# Patient Record
Sex: Female | Born: 1960
Health system: Southern US, Community
[De-identification: ages and names within clinical notes are randomized; demographics above are authoritative.]

## PROBLEM LIST (undated history)

## (undated) DIAGNOSIS — E785 Hyperlipidemia, unspecified: Secondary | ICD-10-CM

## (undated) DIAGNOSIS — K635 Polyp of colon: Secondary | ICD-10-CM

## (undated) DIAGNOSIS — I10 Essential (primary) hypertension: Secondary | ICD-10-CM

## (undated) DIAGNOSIS — R030 Elevated blood-pressure reading, without diagnosis of hypertension: Secondary | ICD-10-CM

## (undated) DIAGNOSIS — R002 Palpitations: Secondary | ICD-10-CM

## (undated) DIAGNOSIS — E669 Obesity, unspecified: Secondary | ICD-10-CM

## (undated) HISTORY — DX: Hyperlipidemia, unspecified: E78.5

## (undated) HISTORY — DX: Essential (primary) hypertension: I10

## (undated) HISTORY — DX: Polyp of colon: K63.5

## (undated) HISTORY — PX: TONSILLECTOMY: SUR1361

## (undated) HISTORY — DX: Elevated blood-pressure reading, without diagnosis of hypertension: R03.0

## (undated) HISTORY — DX: Palpitations: R00.2

## (undated) HISTORY — DX: Obesity, unspecified: E66.9

---

## 1998-01-26 ENCOUNTER — Emergency Department (HOSPITAL_COMMUNITY): Admission: EM | Admit: 1998-01-26 | Discharge: 1998-01-26 | Payer: Self-pay | Admitting: Emergency Medicine

## 1998-01-29 ENCOUNTER — Other Ambulatory Visit: Admission: RE | Admit: 1998-01-29 | Discharge: 1998-01-29 | Payer: Self-pay | Admitting: Obstetrics & Gynecology

## 1998-03-07 ENCOUNTER — Inpatient Hospital Stay (HOSPITAL_COMMUNITY): Admission: AD | Admit: 1998-03-07 | Discharge: 1998-03-09 | Payer: Self-pay | Admitting: Obstetrics and Gynecology

## 1998-04-16 ENCOUNTER — Other Ambulatory Visit: Admission: RE | Admit: 1998-04-16 | Discharge: 1998-04-16 | Payer: Self-pay | Admitting: Obstetrics & Gynecology

## 1999-07-10 ENCOUNTER — Other Ambulatory Visit: Admission: RE | Admit: 1999-07-10 | Discharge: 1999-07-10 | Payer: Self-pay | Admitting: Obstetrics and Gynecology

## 1999-07-26 ENCOUNTER — Encounter: Payer: Self-pay | Admitting: Obstetrics and Gynecology

## 1999-07-26 ENCOUNTER — Ambulatory Visit (HOSPITAL_COMMUNITY): Admission: RE | Admit: 1999-07-26 | Discharge: 1999-07-26 | Payer: Self-pay | Admitting: Obstetrics and Gynecology

## 2000-11-02 ENCOUNTER — Other Ambulatory Visit: Admission: RE | Admit: 2000-11-02 | Discharge: 2000-11-02 | Payer: Self-pay | Admitting: Obstetrics and Gynecology

## 2001-11-26 ENCOUNTER — Other Ambulatory Visit: Admission: RE | Admit: 2001-11-26 | Discharge: 2001-11-26 | Payer: Self-pay | Admitting: Obstetrics and Gynecology

## 2001-12-23 ENCOUNTER — Encounter: Payer: Self-pay | Admitting: Obstetrics and Gynecology

## 2001-12-23 ENCOUNTER — Ambulatory Visit (HOSPITAL_COMMUNITY): Admission: RE | Admit: 2001-12-23 | Discharge: 2001-12-23 | Payer: Self-pay | Admitting: Obstetrics and Gynecology

## 2003-01-26 ENCOUNTER — Other Ambulatory Visit: Admission: RE | Admit: 2003-01-26 | Discharge: 2003-01-26 | Payer: Self-pay | Admitting: Obstetrics and Gynecology

## 2003-02-16 ENCOUNTER — Encounter: Payer: Self-pay | Admitting: Obstetrics and Gynecology

## 2003-02-16 ENCOUNTER — Ambulatory Visit (HOSPITAL_COMMUNITY): Admission: RE | Admit: 2003-02-16 | Discharge: 2003-02-16 | Payer: Self-pay | Admitting: Obstetrics and Gynecology

## 2004-05-29 ENCOUNTER — Other Ambulatory Visit: Admission: RE | Admit: 2004-05-29 | Discharge: 2004-05-29 | Payer: Self-pay | Admitting: Obstetrics and Gynecology

## 2004-07-04 ENCOUNTER — Ambulatory Visit (HOSPITAL_COMMUNITY): Admission: RE | Admit: 2004-07-04 | Discharge: 2004-07-04 | Payer: Self-pay | Admitting: Obstetrics and Gynecology

## 2005-06-18 ENCOUNTER — Other Ambulatory Visit: Admission: RE | Admit: 2005-06-18 | Discharge: 2005-06-18 | Payer: Self-pay | Admitting: Obstetrics and Gynecology

## 2005-08-07 ENCOUNTER — Ambulatory Visit (HOSPITAL_COMMUNITY): Admission: RE | Admit: 2005-08-07 | Discharge: 2005-08-07 | Payer: Self-pay | Admitting: Obstetrics and Gynecology

## 2005-08-27 ENCOUNTER — Encounter: Admission: RE | Admit: 2005-08-27 | Discharge: 2005-08-27 | Payer: Self-pay | Admitting: Obstetrics and Gynecology

## 2006-03-06 ENCOUNTER — Encounter: Admission: RE | Admit: 2006-03-06 | Discharge: 2006-03-06 | Payer: Self-pay | Admitting: Obstetrics and Gynecology

## 2007-03-30 ENCOUNTER — Encounter: Admission: RE | Admit: 2007-03-30 | Discharge: 2007-03-30 | Payer: Self-pay | Admitting: Obstetrics and Gynecology

## 2007-08-16 HISTORY — PX: US ECHOCARDIOGRAPHY: HXRAD669

## 2008-04-24 ENCOUNTER — Ambulatory Visit (HOSPITAL_COMMUNITY): Admission: RE | Admit: 2008-04-24 | Discharge: 2008-04-24 | Payer: Self-pay | Admitting: Obstetrics and Gynecology

## 2009-01-04 HISTORY — PX: OTHER SURGICAL HISTORY: SHX169

## 2009-05-23 ENCOUNTER — Ambulatory Visit (HOSPITAL_COMMUNITY): Admission: RE | Admit: 2009-05-23 | Discharge: 2009-05-23 | Payer: Self-pay | Admitting: Obstetrics and Gynecology

## 2010-10-02 ENCOUNTER — Other Ambulatory Visit (HOSPITAL_COMMUNITY): Payer: Self-pay | Admitting: Obstetrics and Gynecology

## 2010-10-02 DIAGNOSIS — Z139 Encounter for screening, unspecified: Secondary | ICD-10-CM

## 2010-10-02 DIAGNOSIS — Z1231 Encounter for screening mammogram for malignant neoplasm of breast: Secondary | ICD-10-CM

## 2010-10-16 ENCOUNTER — Ambulatory Visit (HOSPITAL_COMMUNITY): Payer: BC Managed Care – PPO

## 2010-10-23 ENCOUNTER — Encounter (HOSPITAL_COMMUNITY): Payer: Self-pay

## 2010-10-23 ENCOUNTER — Ambulatory Visit (HOSPITAL_COMMUNITY)
Admission: RE | Admit: 2010-10-23 | Discharge: 2010-10-23 | Disposition: A | Discharge status: Home / Self Care | Payer: BC Managed Care – PPO | Source: Ambulatory Visit | Attending: Obstetrics and Gynecology | Admitting: Obstetrics and Gynecology

## 2010-10-23 DIAGNOSIS — Z1231 Encounter for screening mammogram for malignant neoplasm of breast: Secondary | ICD-10-CM | POA: Insufficient documentation

## 2011-01-10 ENCOUNTER — Encounter: Payer: Self-pay | Admitting: Nurse Practitioner

## 2011-01-10 ENCOUNTER — Encounter: Payer: Self-pay | Admitting: Cardiology

## 2011-01-10 ENCOUNTER — Telehealth: Payer: Self-pay | Admitting: Cardiology

## 2011-01-10 ENCOUNTER — Ambulatory Visit (INDEPENDENT_AMBULATORY_CARE_PROVIDER_SITE_OTHER): Payer: BC Managed Care – PPO | Admitting: Nurse Practitioner

## 2011-01-10 DIAGNOSIS — E785 Hyperlipidemia, unspecified: Secondary | ICD-10-CM

## 2011-01-10 DIAGNOSIS — R079 Chest pain, unspecified: Secondary | ICD-10-CM | POA: Insufficient documentation

## 2011-01-10 DIAGNOSIS — I10 Essential (primary) hypertension: Secondary | ICD-10-CM | POA: Insufficient documentation

## 2011-01-10 DIAGNOSIS — R030 Elevated blood-pressure reading, without diagnosis of hypertension: Secondary | ICD-10-CM | POA: Insufficient documentation

## 2011-01-10 MED ORDER — ASPIRIN EC 81 MG PO TBEC: 81.0000 mg | DELAYED_RELEASE_TABLET | Freq: Every day | ORAL | Status: DC

## 2011-01-10 MED ORDER — NITROGLYCERIN 0.4 MG SL SUBL: 0.4000 mg | SUBLINGUAL_TABLET | SUBLINGUAL | Status: DC | PRN

## 2011-01-10 NOTE — Telephone Encounter (Signed)
Called stating she has been having "slight chest discomfort"-"pressure" over past few days. Wanted to be seen today. Spoke w/ Lawson Fiscal; will see this afternoon

## 2011-01-10 NOTE — Assessment & Plan Note (Signed)
Will check fasting labs next week. More than likely she will need an lipid lowering agent.

## 2011-01-10 NOTE — Patient Instructions (Signed)
I want you to start on a baby aspirin each day.  I have sent in a prescription for NTG to use if needed. Use your NTG under your tongue for recurrent chest pain. May take one tablet every 5 minutes. If you are still having discomfort after 3 tablets in 15 minutes, call 911.  We will arrange for a stress test early next week.

## 2011-01-10 NOTE — Telephone Encounter (Signed)
Having some chest discomfort and wants to speak w/RN to see if she should come in

## 2011-01-10 NOTE — Progress Notes (Signed)
   Nicole Jefferson Date of Birth: 1961/09/07   History of Present Illness: Nicole Jefferson is seen today for a work in visit. She is seen for Dr. Deborah Jefferson. She has been having some chest discomfort over the last day or so. It is under her breasts. Sometimes, she feels a fullness in her throat. It is not exertional in nature. Yesterday, she felt tightness in her chest. She could feel it in her throat. She does not exercise. She has a strikingly positive family history. Her brother had sudden death/MI last week and had the "cooling" protocol. She did not take the statin as prescribed on a past visit. Cholesterol levels were borderline. Blood pressure is up a little today. She has had no recent lab work and is due for a physical next month at the Urgent Care. She comes in more today because of concern for her own cardiovascular risk. She is not really having palpitations. She does have DOE with stairs. She has continued to gain weight. She takes no medicines on a routine basis.    No current outpatient prescriptions on file prior to visit.    No Known Allergies  Past Medical History  Diagnosis Date  . Palpitations   . Obese   . Borderline hyperlipidemia   . Borderline systolic HTN   . Colon polyp     Past Surgical History  Procedure Date  . Tonsillectomy   . US echocardiography 08/16/07    stress echo negative  . 48 hr monitor 01/04/09    no significant arrhythmia    History  Smoking status  . Never Smoker   Smokeless tobacco  . Not on file    History  Alcohol Use No    Family History  Problem Relation Age of Onset  . Heart disease Father   . Cancer Father   . Heart attack Brother     5/12    Review of Systems: The review of systems is as above. She does have issues with her back.  All other systems were reviewed and are negative.  Physical Exam: BP 144/90  Pulse 68  Ht 5\' 5"  (1.651 m)  Wt 209 lb (94.802 kg)  BMI 34.78 kg/m2  LMP 12/22/2010 Patient is very pleasant and  in no acute distress. She is currently pain free. She is obese. Skin is warm and dry. Color is normal.  HEENT is unremarkable. Normocephalic/atraumatic. PERRL. Sclera are nonicteric. Neck is supple. No masses. No JVD. Lungs are clear. Cardiac exam shows a regular rate and rhythm. Abdomen is soft. Extremities are without edema. Gait and ROM are intact. No gross neurologic deficits noted.  LABORATORY DATA:  EKG is normal   Assessment / Plan:

## 2011-01-10 NOTE — Assessment & Plan Note (Signed)
Will recheck on return and start medicines as needed.

## 2011-01-10 NOTE — Telephone Encounter (Signed)
Some chest discomfort-begin yesterday. She wants to make sure that her heart is OK.  Patient sounds calm and does not sound like she is in distress. Pls.call to make further assessment.

## 2011-01-10 NOTE — Assessment & Plan Note (Signed)
She has multiple cardiovascular risk factors (obesity, borderline blood pressure, hyperlipidemia and positive family history). I have started her on aspirin therapy. NTG sl has been prescribed.  I have ordered a stress test. We will check fasting labs on her next week. Regardless if she passes or fails her stress test, she needs a follow up visit to address these other risk factors. Patient is agreeable to this plan and will call if any problems develop in the interim.

## 2011-01-14 ENCOUNTER — Telehealth: Payer: Self-pay | Admitting: Cardiology

## 2011-01-14 ENCOUNTER — Other Ambulatory Visit (INDEPENDENT_AMBULATORY_CARE_PROVIDER_SITE_OTHER): Payer: BC Managed Care – PPO | Admitting: *Deleted

## 2011-01-14 ENCOUNTER — Other Ambulatory Visit: Payer: Self-pay | Admitting: *Deleted

## 2011-01-14 DIAGNOSIS — E785 Hyperlipidemia, unspecified: Secondary | ICD-10-CM

## 2011-01-14 DIAGNOSIS — E7800 Pure hypercholesterolemia, unspecified: Secondary | ICD-10-CM

## 2011-01-14 DIAGNOSIS — Z79899 Other long term (current) drug therapy: Secondary | ICD-10-CM

## 2011-01-14 LAB — CBC WITH DIFFERENTIAL/PLATELET
Basophils Absolute: 0 10*3/uL (ref 0.0–0.1)
Basophils Relative: 0.5 % (ref 0.0–3.0)
Eosinophils Absolute: 0.1 10*3/uL (ref 0.0–0.7)
Eosinophils Relative: 2.4 % (ref 0.0–5.0)
HCT: 37.5 % (ref 36.0–46.0)
Hemoglobin: 12.8 g/dL (ref 12.0–15.0)
Lymphocytes Relative: 23.8 % (ref 12.0–46.0)
Lymphs Abs: 1 10*3/uL (ref 0.7–4.0)
MCHC: 34.2 g/dL (ref 30.0–36.0)
MCV: 89.9 fl (ref 78.0–100.0)
Monocytes Absolute: 0.3 10*3/uL (ref 0.1–1.0)
Monocytes Relative: 7.6 % (ref 3.0–12.0)
Neutro Abs: 2.9 10*3/uL (ref 1.4–7.7)
Neutrophils Relative %: 65.7 % (ref 43.0–77.0)
Platelets: 237 10*3/uL (ref 150.0–400.0)
RBC: 4.17 Mil/uL (ref 3.87–5.11)
RDW: 13.5 % (ref 11.5–14.6)
WBC: 4.4 10*3/uL — ABNORMAL LOW (ref 4.5–10.5)

## 2011-01-14 LAB — HEPATIC FUNCTION PANEL
ALT: 11 U/L (ref 0–35)
AST: 15 U/L (ref 0–37)
Albumin: 3.7 g/dL (ref 3.5–5.2)
Alkaline Phosphatase: 42 U/L (ref 39–117)
Bilirubin, Direct: 0 mg/dL (ref 0.0–0.3)
Total Bilirubin: 0.6 mg/dL (ref 0.3–1.2)
Total Protein: 6.6 g/dL (ref 6.0–8.3)

## 2011-01-14 LAB — BASIC METABOLIC PANEL
BUN: 14 mg/dL (ref 6–23)
CO2: 27 mEq/L (ref 19–32)
Calcium: 8.9 mg/dL (ref 8.4–10.5)
Chloride: 109 mEq/L (ref 96–112)
Creatinine, Ser: 0.7 mg/dL (ref 0.4–1.2)
GFR: 88.1 mL/min (ref >60.00)
Glucose, Bld: 90 mg/dL (ref 70–99)
Potassium: 4.3 mEq/L (ref 3.5–5.1)
Sodium: 142 mEq/L (ref 135–145)

## 2011-01-14 LAB — LIPID PANEL
Cholesterol: 180 mg/dL (ref 0–200)
HDL: 40 mg/dL (ref >39.00)
LDL Cholesterol: 121 mg/dL — ABNORMAL HIGH (ref 0–99)
Total CHOL/HDL Ratio: 5
Triglycerides: 95 mg/dL (ref 0.0–149.0)
VLDL: 19 mg/dL (ref 0.0–40.0)

## 2011-01-14 LAB — TSH: TSH: 1.82 u[IU]/mL (ref 0.35–5.50)

## 2011-01-14 NOTE — Telephone Encounter (Signed)
Lab results reviewed with patient. Scheduled 6 wk recheck of labs. Medication recommendation of Crestor 5mg  per Lawson Fiscal. Samples available pt to stop by office on May 14 tho pick up

## 2011-01-20 ENCOUNTER — Other Ambulatory Visit (HOSPITAL_COMMUNITY): Payer: BC Managed Care – PPO | Admitting: Radiology

## 2011-01-27 ENCOUNTER — Encounter (HOSPITAL_COMMUNITY): Payer: Self-pay | Admitting: Radiology

## 2011-01-27 ENCOUNTER — Ambulatory Visit (HOSPITAL_COMMUNITY): Payer: BC Managed Care – PPO | Attending: Cardiology | Admitting: Radiology

## 2011-01-27 DIAGNOSIS — R079 Chest pain, unspecified: Secondary | ICD-10-CM | POA: Insufficient documentation

## 2011-01-27 DIAGNOSIS — R0789 Other chest pain: Secondary | ICD-10-CM

## 2011-01-27 DIAGNOSIS — R0602 Shortness of breath: Secondary | ICD-10-CM

## 2011-01-27 DIAGNOSIS — R0989 Other specified symptoms and signs involving the circulatory and respiratory systems: Secondary | ICD-10-CM

## 2011-01-27 MED ORDER — REGADENOSON 0.4 MG/5ML IV SOLN
0.4000 mg | Freq: Once | INTRAVENOUS | Status: AC
  Administered 2011-01-27: 0.4 mg via INTRAVENOUS

## 2011-01-27 MED ORDER — TECHNETIUM TC 99M TETROFOSMIN IV KIT
10.3000 | PACK | Freq: Once | INTRAVENOUS | Status: AC | PRN
  Administered 2011-01-27: 10 via INTRAVENOUS

## 2011-01-27 MED ORDER — TECHNETIUM TC 99M TETROFOSMIN IV KIT
33.0000 | PACK | Freq: Once | INTRAVENOUS | Status: AC | PRN
  Administered 2011-01-27: 33 via INTRAVENOUS

## 2011-01-27 NOTE — Progress Notes (Signed)
[facility] SITE 3 NUCLEAR MED [address] [place] [address] [phone]  Cardiology Nuclear Med Study  Nicole Jefferson is a 50 y.o. female [id number] Jan 29, 1961   Nuclear Med Background Indication for Stress Test:  Evaluation for Ischemia History: 08 Stress Echo: normal, GXT 3 yrs ago and normal and Myocardial Perfusion Study 8 yrs ago and normal Cardiac Risk Factors: Family History - CAD, Hypertension and Lipids  Symptoms:  Chest Tightness radiates to throat, DOE and Palpitations    Nuclear Pre-Procedure Caffeine/Decaff Intake:  None NPO After: 7:00pm   Lungs:  clear IV 0.9% NS with Angio Cath:  22g  IV Site: R Hand  IV Started by:  Bonnita Levan, RN  Chest Size (in):  38 Cup Size: D  Height: 5\' 5"  (1.651 m)  Weight:  208 lb (94.348 kg)  BMI:  Body mass index is 34.61 kg/(m^2). Tech Comments:  N/A    Nuclear Med Study 1 or 2 day study: 1 day  Stress Test Type:  Treadmill/Lexiscan  Reading MD: Kristeen Miss, MD  Order Authorizing Provider:  Dr. Roger Shelter  Resting Radionuclide: Technetium 77m Tetrofosmin  Resting Radionuclide Dose: 10.3 mCi   Stress Radionuclide:  Technetium 50m Tetrofosmin  Stress Radionuclide Dose: 33 mCi           Stress Protocol Rest HR: 69 Stress HR: 126  Rest BP: 113/77 Stress BP: 145/76  Exercise Time (min): 2:00 METS: 1.6   Predicted Max HR: 170 bpm % Max HR: 74.12 bpm Rate Pressure Product: 21308   Dose of Adenosine (mg):  n/a Dose of Lexiscan: 0.4 mg  Dose of Atropine (mg): n/a Dose of Dobutamine: n/a mcg/kg/min (at max HR)  Stress Test Technologist: Cathlyn Parsons, RN  Nuclear Technologist:  Doyne Keel, CNMT     Rest Procedure:  Myocardial perfusion imaging was performed at rest 45 minutes following the intravenous administration of Technetium 30m Tetrofosmin. Rest ECG: NSR  Stress Procedure:  The patient received IV Lexiscan 0.4 mg over 15-seconds with concurrent low level exercise and then  Technetium 48m Tetrofosmin was injected at 30-seconds while the patient continued walking one more minute. Patient had chest tightness 6/10 with infusion of Lexiscan but resolved in recovery. There were no significant changes with Lexiscan.  Quantitative spect images were obtained after a 45-minute delay. Stress ECG: No significant change from baseline ECG  QPS Raw Data Images:  Normal; no motion artifact; normal heart/lung ratio. Stress Images:  Normal homogeneous uptake in all areas of the myocardium. Rest Images:  Normal homogeneous uptake in all areas of the myocardium. Subtraction (SDS):  Normal Transient Ischemic Dilatation (Normal <1.22):  .96 Lung/Heart Ratio (Normal <0.45):  .32  Quantitative Gated Spect Images QGS EDV:  86 ml QGS ESV:  26 ml QGS cine images:  NL LV Function; NL Wall Motion QGS EF: 70%  Impression Exercise Capacity:  Lexiscan with no exercise. BP Response:  Normal blood pressure response. Clinical Symptoms:  No chest pain. ECG Impression:  No significant ST segment change suggestive of ischemia. Comparison with Prior Nuclear Study: No previous nuclear study performed  Overall Impression:  Normal stress nuclear study.  No evidence of ischemia.  Normal LV function.    Elyn Aquas.. MD, [facility]

## 2011-01-28 NOTE — Progress Notes (Signed)
COPY ROUTED TO DR.Tennant.Mirna Mires

## 2011-01-30 NOTE — Progress Notes (Signed)
Stress test results reported to pt

## 2011-02-24 ENCOUNTER — Other Ambulatory Visit: Payer: Self-pay | Admitting: *Deleted

## 2011-02-24 DIAGNOSIS — E785 Hyperlipidemia, unspecified: Secondary | ICD-10-CM

## 2011-02-25 ENCOUNTER — Other Ambulatory Visit: Payer: BC Managed Care – PPO | Admitting: *Deleted

## 2011-10-01 ENCOUNTER — Ambulatory Visit (INDEPENDENT_AMBULATORY_CARE_PROVIDER_SITE_OTHER): Payer: BC Managed Care – PPO

## 2011-10-01 DIAGNOSIS — M25549 Pain in joints of unspecified hand: Secondary | ICD-10-CM

## 2011-10-01 DIAGNOSIS — M6740 Ganglion, unspecified site: Secondary | ICD-10-CM

## 2011-10-13 ENCOUNTER — Telehealth: Payer: Self-pay

## 2011-10-13 ENCOUNTER — Encounter: Payer: Self-pay | Admitting: Family Medicine

## 2011-10-13 ENCOUNTER — Ambulatory Visit (INDEPENDENT_AMBULATORY_CARE_PROVIDER_SITE_OTHER): Payer: BC Managed Care – PPO | Admitting: Family Medicine

## 2011-10-13 VITALS — BP 134/84 | HR 66 | Temp 98.4°F | Resp 16 | Ht 64.25 in | Wt 206.0 lb

## 2011-10-13 DIAGNOSIS — R059 Cough, unspecified: Secondary | ICD-10-CM

## 2011-10-13 DIAGNOSIS — N390 Urinary tract infection, site not specified: Secondary | ICD-10-CM

## 2011-10-13 DIAGNOSIS — H6120 Impacted cerumen, unspecified ear: Secondary | ICD-10-CM

## 2011-10-13 DIAGNOSIS — M67439 Ganglion, unspecified wrist: Secondary | ICD-10-CM | POA: Insufficient documentation

## 2011-10-13 DIAGNOSIS — R05 Cough: Secondary | ICD-10-CM

## 2011-10-13 DIAGNOSIS — R300 Dysuria: Secondary | ICD-10-CM

## 2011-10-13 LAB — POCT UA - MICROSCOPIC ONLY
Casts, Ur, LPF, POC: NEGATIVE
Yeast, UA: NEGATIVE

## 2011-10-13 LAB — POCT URINALYSIS DIPSTICK
Bilirubin, UA: NEGATIVE
Glucose, UA: NEGATIVE
Protein, UA: NEGATIVE

## 2011-10-13 MED ORDER — LEVOFLOXACIN 500 MG PO TABS: 500.0000 mg | ORAL_TABLET | Freq: Every day | ORAL | Status: AC

## 2011-10-13 MED ORDER — HYDROCODONE-HOMATROPINE 5-1.5 MG/5ML PO SYRP: 5.0000 mL | ORAL_SOLUTION | Freq: Four times a day (QID) | ORAL | Status: AC | PRN

## 2011-10-13 NOTE — Progress Notes (Signed)
Pt was triaged by Jaymes Graff, CMA under Oneita Hurt log in.

## 2011-10-13 NOTE — Progress Notes (Signed)
Results for orders placed in visit on 10/13/11  POCT URINALYSIS DIPSTICK      Component Value Range   Color, UA yellow     Clarity, UA hazy     Glucose, UA negative     Bilirubin, UA negative     Ketones, UA negative     Spec Grav, UA >=1.030     Blood, UA negative     pH, UA 5.0     Protein, UA negative     Urobilinogen, UA 0.2     Nitrite, UA negative     Leukocytes, UA Trace    POCT UA - MICROSCOPIC ONLY      Component Value Range   WBC, Ur, HPF, POC 4-6     RBC, urine, microscopic 1-2     Bacteria, U Microscopic small     Mucus, UA small     Epithelial cells, urine per micros 2-4     Crystals, Ur, HPF, POC negative     Casts, Ur, LPF, POC negative     Yeast, UA negative     Nicole Jefferson is a 51 year old woman comes in with several problems. Her right wrist ganglion continues to be tender but function is normal. She has not yet called her hand surgeon hoping that the problem will go away. The problem is that she has malodorous urine, a cough of 3 days and earwax impaction on the right.  Objective: Chest is clear, HEENT is positive for cerumen on the right. The right canal was irrigated clean oropharynx is negative. A 2 mm nodule is still palpable on the right dorsal wrist at the base of the first carpal.  Assessment: Ganglion cyst right hand, UTI, bronchitis.  Plan: AVS

## 2011-10-13 NOTE — Telephone Encounter (Signed)
Patient was seen in office today and was given 2 rx and she went to pick them up and states only one was filled she is needing rx for cough medication. She would like for someone to fax rx to [facility] [address] please contact if any questions.

## 2011-10-13 NOTE — Patient Instructions (Signed)
Ganglion A ganglion is a swelling under the skin that is filled with a thick, jelly-like substance. It is a synovial cyst. This is caused by a break (rupture) of the joint lining from the joint space. A ganglion often occurs near an area of repeated minor trauma (damage caused by an accident). Trauma may also be a repetitive movement at work or in a sport. TREATMENT  It often goes away without treatment. It may reappear later. Sometimes a ganglion may need to be surgically removed. Often they are drained and injected with a steroid. Sometimes they respond to:  Rest.   Splinting.  HOME CARE INSTRUCTIONS   Your caregiver will decide the best way of treating your ganglion. Do not try to break the ganglion yourself by pressing on it, poking it with a needle, or hitting it with a heavy object.   Use medications as directed.  SEEK MEDICAL CARE IF:   The ganglion becomes larger or more painful.   You have increased redness or swelling.   You have weakness or numbness in your hand or wrist.  MAKE SURE YOU:   Understand these instructions.   Will watch your condition.   Will get help right away if you are not doing well or get worse.  Document Released: 08/22/2000 Document Revised: 05/07/2011 Document Reviewed: 10/19/2007 [facility] Patient Information 2012 [place], [place].Bronchitis Bronchitis is the body's way of reacting to injury and/or infection (inflammation) of the bronchi. Bronchi are the air tubes that extend from the windpipe into the lungs. If the inflammation becomes severe, it may cause shortness of breath. CAUSES  Inflammation may be caused by:  A virus.   Germs (bacteria).   Dust.   Allergens.   Pollutants and many other irritants.  The cells lining the bronchial tree are covered with tiny hairs (cilia). These constantly beat upward, away from the lungs, toward the mouth. This keeps the lungs free of pollutants. When these cells become too irritated and are unable to do  their job, mucus begins to develop. This causes the characteristic cough of bronchitis. The cough clears the lungs when the cilia are unable to do their job. Without either of these protective mechanisms, the mucus would settle in the lungs. Then you would develop pneumonia. Smoking is a common cause of bronchitis and can contribute to pneumonia. Stopping this habit is the single most important thing you can do to help yourself. TREATMENT   Your caregiver may prescribe an antibiotic if the cough is caused by bacteria. Also, medicines that open up your airways make it easier to breathe. Your caregiver may also recommend or prescribe an expectorant. It will loosen the mucus to be coughed up. Only take over-the-counter or prescription medicines for pain, discomfort, or fever as directed by your caregiver.   Removing whatever causes the problem (smoking, for example) is critical to preventing the problem from getting worse.   Cough suppressants may be prescribed for relief of cough symptoms.   Inhaled medicines may be prescribed to help with symptoms now and to help prevent problems from returning.   For those with recurrent (chronic) bronchitis, there may be a need for steroid medicines.  SEEK IMMEDIATE MEDICAL CARE IF:   During treatment, you develop more pus-like mucus (purulent sputum).   You have a fever.   Your baby is older than 3 months with a rectal temperature of 102 F (38.9 C) or higher.   Your baby is 31 months old or younger with a rectal temperature of  100.4 F (38 C) or higher.   You become progressively more ill.   You have increased difficulty breathing, wheezing, or shortness of breath.  It is necessary to seek immediate medical care if you are elderly or sick from any other disease. MAKE SURE YOU:   Understand these instructions.   Will watch your condition.   Will get help right away if you are not doing well or get worse.  Document Released: 08/25/2005 Document  Revised: 05/07/2011 Document Reviewed: 07/04/2008 [facility] Patient Information 2012 [place], [place].Urinary Tract Infection Infections of the urinary tract can start in several places. A bladder infection (cystitis), a kidney infection (pyelonephritis), and a prostate infection (prostatitis) are different types of urinary tract infections (UTIs). They usually get better if treated with medicines (antibiotics) that kill germs. Take all the medicine until it is gone. You or your child may feel better in a few days, but TAKE ALL MEDICINE or the infection may not respond and may become more difficult to treat. HOME CARE INSTRUCTIONS   Drink enough water and fluids to keep the urine clear or pale yellow. Cranberry juice is especially recommended, in addition to large amounts of water.   Avoid caffeine, tea, and carbonated beverages. They tend to irritate the bladder.   Alcohol may irritate the prostate.   Only take over-the-counter or prescription medicines for pain, discomfort, or fever as directed by your caregiver.  To prevent further infections:  Empty the bladder often. Avoid holding urine for long periods of time.   After a bowel movement, women should cleanse from front to back. Use each tissue only once.   Empty the bladder before and after sexual intercourse.  FINDING OUT THE RESULTS OF YOUR TEST Not all test results are available during your visit. If your or your child's test results are not back during the visit, make an appointment with your caregiver to find out the results. Do not assume everything is normal if you have not heard from your caregiver or the medical facility. It is important for you to follow up on all test results. SEEK MEDICAL CARE IF:   There is back pain.   Your baby is older than 3 months with a rectal temperature of 100.5 F (38.1 C) or higher for more than 1 day.   Your or your child's problems (symptoms) are no better in 3 days. Return sooner if you or  your child is getting worse.  SEEK IMMEDIATE MEDICAL CARE IF:   There is severe back pain or lower abdominal pain.   You or your child develops chills.   You have a fever.   Your baby is older than 3 months with a rectal temperature of 102 F (38.9 C) or higher.   Your baby is 73 months old or younger with a rectal temperature of 100.4 F (38 C) or higher.   There is nausea or vomiting.   There is continued burning or discomfort with urination.  MAKE SURE YOU:   Understand these instructions.   Will watch your condition.   Will get help right away if you are not doing well or get worse.  Document Released: 06/04/2005 Document Revised: 05/07/2011 Document Reviewed: 01/07/2007 [facility] Patient Information 2012 [place], [place].

## 2011-10-14 NOTE — Telephone Encounter (Signed)
rx for cough syrup called into pharmacy and pt notified

## 2011-10-15 ENCOUNTER — Other Ambulatory Visit: Payer: Self-pay | Admitting: Obstetrics and Gynecology

## 2011-10-15 DIAGNOSIS — Z1231 Encounter for screening mammogram for malignant neoplasm of breast: Secondary | ICD-10-CM

## 2011-10-15 LAB — URINE CULTURE: Colony Count: 5000

## 2011-10-22 ENCOUNTER — Ambulatory Visit (INDEPENDENT_AMBULATORY_CARE_PROVIDER_SITE_OTHER): Payer: BC Managed Care – PPO | Admitting: Family Medicine

## 2011-10-22 VITALS — BP 130/80 | HR 74 | Temp 97.9°F | Resp 16 | Ht 64.5 in | Wt 204.0 lb

## 2011-10-22 DIAGNOSIS — R05 Cough: Secondary | ICD-10-CM

## 2011-10-22 DIAGNOSIS — J069 Acute upper respiratory infection, unspecified: Secondary | ICD-10-CM

## 2011-10-22 DIAGNOSIS — K602 Anal fissure, unspecified: Secondary | ICD-10-CM

## 2011-10-22 DIAGNOSIS — R059 Cough, unspecified: Secondary | ICD-10-CM

## 2011-10-22 NOTE — Progress Notes (Signed)
This is a 51 year old woman who comes in with an abrupt onset today of a dry deep cough. She has another problem, nodule at the anal verge.  This woman was seen a week ago or so acute bronchitis and was given Levaquin and cough syrup which helped resolve all of her symptoms. The cough today he is a little bit different in that it's dry, deep, associated with malaise.  The nodule at the anal verge has been present for about 3 days. She recently underwent a Pap test 3 weeks ago with rectal exam being negative.  Patient had colonoscopy 2 years ago with rectal polyp. Her father died of anal cancer  Objective: HEENT negative  Lungs clear  Heart regular no murmur  Examination of the anal verge reveals a small tear and slight induration in the anterior portion.  Assessment: Acute cough, most likely viral with small fissure in anal.  Plan: Stool softeners  Hydromet for now, call if symptoms worsen

## 2011-10-24 ENCOUNTER — Telehealth: Payer: Self-pay

## 2011-10-24 NOTE — Telephone Encounter (Signed)
Pt states that the cough medication that she was prescribed is not working, pt still hears a crackling in her lungs at night.

## 2011-10-25 ENCOUNTER — Ambulatory Visit: Payer: BC Managed Care – PPO

## 2011-10-25 ENCOUNTER — Ambulatory Visit (INDEPENDENT_AMBULATORY_CARE_PROVIDER_SITE_OTHER): Payer: BC Managed Care – PPO | Admitting: Physician Assistant

## 2011-10-25 VITALS — BP 135/88 | HR 66 | Temp 98.2°F | Resp 16 | Ht 65.0 in | Wt 205.0 lb

## 2011-10-25 DIAGNOSIS — R05 Cough: Secondary | ICD-10-CM

## 2011-10-25 MED ORDER — ALBUTEROL SULFATE HFA 108 (90 BASE) MCG/ACT IN AERS: 2.0000 | INHALATION_SPRAY | RESPIRATORY_TRACT | Status: DC | PRN

## 2011-10-25 NOTE — Progress Notes (Signed)
  Subjective:    Patient ID: Nicole Jefferson, female    DOB: 1961-04-30, 51 y.o.   MRN: [id number]  HPI    Review of Systems     Objective:   Physical Exam        Assessment & Plan:

## 2011-10-25 NOTE — Progress Notes (Signed)
  Subjective:    Patient ID: Nicole Jefferson, female    DOB: 07-27-1961, 51 y.o.   MRN: [id number]  HPI See previous visits Nicole Jefferson returns today for recheck of her cough and requests a CXR.  States her symptoms improved on Levaquin but returned as soon as she finished the course.  She has had persistent paroxysmal cough and reports feeling feverish the last 2 days. Hears gurgling at night when she goes to bed. Mild SOB when exerting herself.    Review of Systems  Constitutional: Positive for fever (subjective) and chills.  HENT: Negative for congestion, sore throat and rhinorrhea.   Respiratory: Positive for cough and shortness of breath. Negative for wheezing.   Gastrointestinal: Negative for nausea.  Neurological: Positive for headaches.       Objective:   Physical Exam  HENT:  Right Ear: Tympanic membrane normal.  Left Ear: Tympanic membrane normal.  Nose: No mucosal edema.  Mouth/Throat: Oropharynx is clear and moist.  Cardiovascular: Normal rate and regular rhythm.   Pulmonary/Chest: Effort normal and breath sounds normal.       Pt has a very tight cough during exam  Lymphadenopathy:    She has no cervical adenopathy.    [facility] reading (PRIMARY) by  Dr. Neva Seat CXR   NAD        Assessment & Plan:  Post viral cough   Do not feel she needs antibiotic at this point as she has just finished a course of Levaquin. Albuterol inhaler and Mucinex DM recommended.  Push fluids. Call if sx worsen and consider prednisone.

## 2011-10-28 ENCOUNTER — Telehealth: Payer: Self-pay | Admitting: Family Medicine

## 2011-10-28 ENCOUNTER — Other Ambulatory Visit: Payer: Self-pay | Admitting: Family Medicine

## 2011-10-28 MED ORDER — HYDROCOD POLST-CHLORPHEN POLST 10-8 MG/5ML PO LQCR: 5.0000 mL | Freq: Two times a day (BID) | ORAL | Status: DC

## 2011-10-28 NOTE — Telephone Encounter (Signed)
Rx for Tussionex called in per Dr. Cain Saupe request.

## 2011-10-30 ENCOUNTER — Telehealth: Payer: Self-pay | Admitting: Radiology

## 2011-10-30 NOTE — Telephone Encounter (Signed)
Message copied by Luretha Murphy on Thu Oct 30, 2011  8:27 AM ------      Message from: Pattricia Boss      Created: Wed Oct 29, 2011  8:41 PM       See below message            ----- Message -----         From: Rad Results In Interface         Sent: 10/26/2011   8:11 AM           To: Kennedy Bucker, PA-C

## 2011-11-11 ENCOUNTER — Ambulatory Visit (HOSPITAL_COMMUNITY)
Admission: RE | Admit: 2011-11-11 | Discharge: 2011-11-11 | Disposition: A | Discharge status: Home / Self Care | Payer: BC Managed Care – PPO | Source: Ambulatory Visit | Attending: Obstetrics and Gynecology | Admitting: Obstetrics and Gynecology

## 2011-11-11 DIAGNOSIS — Z1231 Encounter for screening mammogram for malignant neoplasm of breast: Secondary | ICD-10-CM

## 2011-11-28 ENCOUNTER — Telehealth: Payer: Self-pay

## 2011-12-16 ENCOUNTER — Encounter: Payer: Self-pay | Admitting: Family Medicine

## 2012-04-28 HISTORY — PX: ENDOVENOUS ABLATION SAPHENOUS VEIN W/ LASER: SUR449

## 2012-05-19 DIAGNOSIS — I8312 Varicose veins of left lower extremity with inflammation: Secondary | ICD-10-CM | POA: Insufficient documentation

## 2012-05-19 DIAGNOSIS — I879 Disorder of vein, unspecified: Secondary | ICD-10-CM | POA: Insufficient documentation

## 2012-06-22 ENCOUNTER — Encounter: Payer: Self-pay | Admitting: Family Medicine

## 2012-06-22 DIAGNOSIS — I872 Venous insufficiency (chronic) (peripheral): Secondary | ICD-10-CM | POA: Insufficient documentation

## 2012-06-22 DIAGNOSIS — I8312 Varicose veins of left lower extremity with inflammation: Secondary | ICD-10-CM

## 2012-06-22 DIAGNOSIS — I879 Disorder of vein, unspecified: Secondary | ICD-10-CM

## 2012-10-05 ENCOUNTER — Ambulatory Visit: Payer: BC Managed Care – PPO | Admitting: Obstetrics and Gynecology

## 2012-10-05 ENCOUNTER — Encounter: Payer: Self-pay | Admitting: Obstetrics and Gynecology

## 2012-10-05 VITALS — BP 122/68 | Ht 65.75 in | Wt 210.0 lb

## 2012-10-05 DIAGNOSIS — I8312 Varicose veins of left lower extremity with inflammation: Secondary | ICD-10-CM

## 2012-10-05 DIAGNOSIS — N838 Other noninflammatory disorders of ovary, fallopian tube and broad ligament: Secondary | ICD-10-CM

## 2012-10-05 DIAGNOSIS — Z800 Family history of malignant neoplasm of digestive organs: Secondary | ICD-10-CM

## 2012-10-05 DIAGNOSIS — Z01419 Encounter for gynecological examination (general) (routine) without abnormal findings: Secondary | ICD-10-CM

## 2012-10-05 MED ORDER — DROSPIRENONE-ETHINYL ESTRADIOL 3-0.02 MG PO TABS: 1.0000 | ORAL_TABLET | Freq: Every day | ORAL | Status: DC

## 2012-10-05 NOTE — Progress Notes (Signed)
The patient reports:has no complaints   Contraception:Ortho Tricyclen  Last mammogram: 11/12/2011 Normal Last pap: 09/30/2011 Normal Colonoscopy: 2012  GC/Chlamydia cultures offered: declined HIV/RPR/HbsAg offered:  declined HSV 1 and 2 glycoprotein offered: declined  Menstrual cycle regular and monthly: Yes every 28-30 days Menstrual flow normal: Yes lasts 7 days and not heavy  Urinary symptoms: none Normal bowel movements: Yes  Reports abuse at home: No:   Had laser vein surgery which led to DVT: was on Coumadin for 6 months so PCP ordered BCP to manage menorrhagia. Has not had a cycle without Coumadin yet.  Subjective:    Nicole Jefferson is a 52 y.o. female, G3P3, who presents for an annual exam.     History   Social History  . Marital Status: Married    Spouse Name: N/A    Number of Children: N/A  . Years of Education: N/A   Social History Main Topics  . Smoking status: Never Smoker   . Smokeless tobacco: Never Used  . Alcohol Use: No  . Drug Use: No  . Sexually Active: Yes    Birth Control/ Protection: Pill   Other Topics Concern  . None   Social History Narrative  . None    Menstrual cycle:   LMP: Patient's last menstrual period was 09/24/2012.           Cycle: normal with BCP  The following portions of the patient's history were reviewed and updated as appropriate: allergies, current medications, past family history, past medical history, past social history, past surgical history and problem list.  Review of Systems Pertinent items are noted in HPI. Breast:Negative for breast lump,nipple discharge or nipple retraction Gastrointestinal: Negative for abdominal pain, change in bowel habits or rectal bleeding Urinary:negative   Objective:    BP 122/68  Ht 5' 5.75" (1.67 m)  Wt 210 lb (95.255 kg)  BMI 34.15 kg/m2  LMP 09/24/2012    Weight:  Wt Readings from Last 1 Encounters:  10/05/12 210 lb (95.255 kg)          BMI: Body mass index is 34.15  kg/(m^2).  General Appearance: Alert, appropriate appearance for age. No acute distress HEENT: Grossly normal Neck / Thyroid: Supple, no masses, nodes or enlargement Lungs: clear to auscultation bilaterally Back: No CVA tenderness Breast Exam: No masses or nodes.No dimpling, nipple retraction or discharge. Cardiovascular: Regular rate and rhythm. S1, S2, no murmur Gastrointestinal: Soft, non-tender, no masses or organomegaly Pelvic Exam: Vulva and vagina appear normal. Bimanual exam reveals normal uterus and adnexa.Left ovary feels larger Rectovaginal: normal rectal, no masses Lymphatic Exam: Non-palpable nodes in neck, clavicular, axillary, or inguinal regions  Skin: no rash or abnormalities Neurologic: Normal gait and speech, no tremor  Psychiatric: Alert and oriented, appropriate affect.    Assessment:    Normal gyn exam    Plan:    mammogram pap smear return annually or prn STD screening: declined FSH (day 6) Yaz sent to pharmacy U/S enlarged ovary   Nicole Lay MD
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
## 2012-11-03 ENCOUNTER — Other Ambulatory Visit: Payer: BC Managed Care – PPO

## 2012-11-03 ENCOUNTER — Encounter: Payer: BC Managed Care – PPO | Admitting: Obstetrics and Gynecology

## 2012-11-16 ENCOUNTER — Other Ambulatory Visit: Payer: Self-pay | Admitting: Obstetrics and Gynecology

## 2012-11-16 DIAGNOSIS — Z1231 Encounter for screening mammogram for malignant neoplasm of breast: Secondary | ICD-10-CM

## 2012-11-22 ENCOUNTER — Ambulatory Visit (HOSPITAL_COMMUNITY)
Admission: RE | Admit: 2012-11-22 | Discharge: 2012-11-22 | Disposition: A | Discharge status: Home / Self Care | Payer: BC Managed Care – PPO | Source: Ambulatory Visit | Attending: Obstetrics and Gynecology | Admitting: Obstetrics and Gynecology

## 2012-11-22 DIAGNOSIS — Z1231 Encounter for screening mammogram for malignant neoplasm of breast: Secondary | ICD-10-CM | POA: Insufficient documentation

## 2013-02-14 ENCOUNTER — Ambulatory Visit (INDEPENDENT_AMBULATORY_CARE_PROVIDER_SITE_OTHER): Payer: BC Managed Care – PPO | Admitting: Family Medicine

## 2013-02-14 VITALS — BP 136/83 | HR 78 | Temp 98.3°F | Resp 16 | Ht 65.0 in | Wt 212.0 lb

## 2013-02-14 DIAGNOSIS — D509 Iron deficiency anemia, unspecified: Secondary | ICD-10-CM

## 2013-02-14 DIAGNOSIS — Z8249 Family history of ischemic heart disease and other diseases of the circulatory system: Secondary | ICD-10-CM

## 2013-02-14 LAB — POCT CBC
Granulocyte percent: 70.5 %G (ref 37–80)
HCT, POC: 43.2 % (ref 37.7–47.9)
Hemoglobin: 13.5 g/dL (ref 12.2–16.2)
Lymph, poc: 1.4 (ref 0.6–3.4)
MCH, POC: 30.3 pg (ref 27–31.2)
MCHC: 31.3 g/dL — AB (ref 31.8–35.4)
MCV: 96.9 fL (ref 80–97)
MID (cbc): 0.4 (ref 0–0.9)
MPV: 9 fL (ref 0–99.8)
POC Granulocyte: 4.3 (ref 2–6.9)
POC LYMPH PERCENT: 22.4 %L (ref 10–50)
POC MID %: 7.1 %M (ref 0–12)
Platelet Count, POC: 238 10*3/uL (ref 142–424)
RBC: 4.46 M/uL (ref 4.04–5.48)
RDW, POC: 13.3 %
WBC: 6.1 10*3/uL (ref 4.6–10.2)

## 2013-02-14 NOTE — Progress Notes (Signed)
52 yo woman with strong family history of CAD.  She would like to have cardiology referral to screen for heart disease. Patient has been having some palpitations (she's had these before) which make her cough Occasional slight pain left lower chest.  Also has a h/o iron deficiency.  Has been evaluated by [facility] 2-3 years ago.  Objective:  NAD Heart: reg, no murmur or gallop  Assessment:  Increased risk for CAD  Plan:  Refer to cardiology Iron deficiency anemia - Plan: Ferritin  FH: CAD (coronary artery disease) - Plan: Lipid panel, POCT CBC, C-reactive protein  Signed, Elvina Sidle, MD

## 2013-02-15 LAB — COMPREHENSIVE METABOLIC PANEL
ALT: 11 U/L (ref 0–35)
AST: 13 U/L (ref 0–37)
Albumin: 4.1 g/dL (ref 3.5–5.2)
Alkaline Phosphatase: 47 U/L (ref 39–117)
BUN: 16 mg/dL (ref 6–23)
CO2: 28 mEq/L (ref 19–32)
Calcium: 9.9 mg/dL (ref 8.4–10.5)
Chloride: 104 mEq/L (ref 96–112)
Creat: 0.88 mg/dL (ref 0.50–1.10)
Glucose, Bld: 128 mg/dL — ABNORMAL HIGH (ref 70–99)
Potassium: 4.2 mEq/L (ref 3.5–5.3)
Sodium: 140 mEq/L (ref 135–145)
Total Bilirubin: 0.3 mg/dL (ref 0.3–1.2)
Total Protein: 6.7 g/dL (ref 6.0–8.3)

## 2013-02-15 LAB — LIPID PANEL
Cholesterol: 194 mg/dL (ref 0–200)
HDL: 42 mg/dL (ref >39)
LDL Cholesterol: 106 mg/dL — ABNORMAL HIGH (ref 0–99)
Total CHOL/HDL Ratio: 4.6 Ratio
Triglycerides: 232 mg/dL — ABNORMAL HIGH (ref <150)
VLDL: 46 mg/dL — ABNORMAL HIGH (ref 0–40)

## 2013-02-15 LAB — C-REACTIVE PROTEIN: CRP: <0.5 mg/dL (ref <0.60)

## 2013-02-15 LAB — FERRITIN: Ferritin: 12 ng/mL (ref 10–291)

## 2013-02-18 ENCOUNTER — Telehealth: Payer: Self-pay

## 2013-02-18 NOTE — Telephone Encounter (Signed)
Patient called returning phone call for lab results. She says she will call back to talk to someone in lab again.

## 2013-04-15 ENCOUNTER — Institutional Professional Consult (permissible substitution): Payer: BC Managed Care – PPO | Admitting: Cardiology

## 2013-06-22 ENCOUNTER — Ambulatory Visit (INDEPENDENT_AMBULATORY_CARE_PROVIDER_SITE_OTHER): Payer: BC Managed Care – PPO | Admitting: Cardiology

## 2013-06-22 ENCOUNTER — Institutional Professional Consult (permissible substitution): Payer: BC Managed Care – PPO | Admitting: Cardiology

## 2013-06-22 ENCOUNTER — Encounter: Payer: Self-pay | Admitting: Cardiology

## 2013-06-22 VITALS — BP 120/84 | HR 68 | Ht 64.0 in | Wt 210.0 lb

## 2013-06-22 DIAGNOSIS — E785 Hyperlipidemia, unspecified: Secondary | ICD-10-CM

## 2013-06-22 DIAGNOSIS — R030 Elevated blood-pressure reading, without diagnosis of hypertension: Secondary | ICD-10-CM

## 2013-06-22 NOTE — Patient Instructions (Signed)
Eat a healthy diet ( Mediterranean ) and loose weight.  Increase your aerobic activity- 30-40 minutes at least 5 days per week  We will see you in a couple of years

## 2013-06-22 NOTE — Progress Notes (Signed)
   Haynes Bast Date of Birth: 1960/12/14 Medical Record #[id number]  History of Present Illness: Nicole Jefferson is seen today for followup. She is a pleasant 52 year old white female who is concerned about her family history. Her father had coronary bypass surgery in his 78s and she had a brother who had a myocardial infarction in his 15s. She has a history of borderline hypertension and dyslipidemia. She had a normal stress Myoview study in 2012. She denies any symptoms of chest pain or shortness of breath. She did have some mild palpitations this past summer as she was entering menopause. These symptoms have resolved. She works for [employer]. She states she can get free shakes and vitamins there. She currently is not eating a healthy diet and hasn't been exercising.  Current Outpatient Prescriptions on File Prior to Visit  Medication Sig Dispense Refill  . ferrous sulfate 325 (65 FE) MG tablet Take 325 mg by mouth daily with breakfast.      . aspirin EC 81 MG EC tablet Take 1 tablet (81 mg total) by mouth daily.  150 tablet  2  . nitroGLYCERIN (NITROSTAT) 0.4 MG SL tablet Place 1 tablet (0.4 mg total) under the tongue every 5 (five) minutes as needed for chest pain.  100 tablet  3   No current facility-administered medications on file prior to visit.    No Known Allergies  Past Medical History  Diagnosis Date  . Palpitations   . Obese   . Borderline hyperlipidemia   . Borderline systolic HTN   . Colon polyp     Past Surgical History  Procedure Laterality Date  . Tonsillectomy    . US echocardiography  08/16/07    stress echo negative  . 48 hr monitor  01/04/09    no significant arrhythmia  . Endovenous ablation saphenous vein w/ laser  04/28/2012    History  Smoking status  . Never Smoker   Smokeless tobacco  . Never Used    History  Alcohol Use No    Family History  Problem Relation Age of Onset  . Heart disease Father   . Cancer Father   . Heart attack Brother      5/12  . Alzheimer's disease Maternal Grandmother   . Stroke Maternal Grandfather   . Stroke Paternal Grandfather     Review of Systems: As noted in history of present illness.  All other systems were reviewed and are negative.  Physical Exam: BP 120/84  Pulse 68  Ht 5\' 4"  (1.626 m)  Wt 210 lb (95.255 kg)  BMI 36.03 kg/m2 She is an overweight white female in no acute distress. HEENT: Normal Neck: No JVD or adenopathy. No bruits. Lungs: Clear Cardiovascular: Regular rate and rhythm without gallop, murmur, or click. Abdomen: Soft and nontender. No masses or bruits. Extremities: No cyanosis or edema. Pulses are 2+ and symmetric. Neuro: Alert and oriented x3. Cranial nerves II through XII are intact.  LABORATORY DATA: ECG today demonstrates normal sinus rhythm with a normal ECG.  Assessment / Plan: 1. Borderline hypertension.  2. Mild dyslipidemia.  3. Obesity.  4. Family history of early coronary disease.  Plan: I recommended lifestyle modification with focus on a healthy, Mediterranean style diet, weight loss, and regular aerobic exercise. I explained that there are no short cuts to this and that she cannot use shakes or vitamins to become healthy. She had normal stress test in 2010 and 2012.

## 2013-07-14 ENCOUNTER — Other Ambulatory Visit: Payer: Self-pay

## 2014-02-06 ENCOUNTER — Ambulatory Visit (INDEPENDENT_AMBULATORY_CARE_PROVIDER_SITE_OTHER): Payer: BC Managed Care – PPO | Admitting: Physician Assistant

## 2014-02-06 VITALS — BP 132/80 | HR 68 | Temp 97.8°F | Resp 16 | Ht 65.0 in | Wt 212.0 lb

## 2014-02-06 DIAGNOSIS — T148 Other injury of unspecified body region: Secondary | ICD-10-CM

## 2014-02-06 DIAGNOSIS — Z131 Encounter for screening for diabetes mellitus: Secondary | ICD-10-CM

## 2014-02-06 DIAGNOSIS — W57XXXA Bitten or stung by nonvenomous insect and other nonvenomous arthropods, initial encounter: Secondary | ICD-10-CM

## 2014-02-06 DIAGNOSIS — Z8639 Personal history of other endocrine, nutritional and metabolic disease: Secondary | ICD-10-CM

## 2014-02-06 DIAGNOSIS — E785 Hyperlipidemia, unspecified: Secondary | ICD-10-CM

## 2014-02-06 DIAGNOSIS — R21 Rash and other nonspecific skin eruption: Secondary | ICD-10-CM

## 2014-02-06 DIAGNOSIS — Z862 Personal history of diseases of the blood and blood-forming organs and certain disorders involving the immune mechanism: Secondary | ICD-10-CM

## 2014-02-06 MED ORDER — TRIAMCINOLONE ACETONIDE 0.1 % EX CREA: 1.0000 "application " | TOPICAL_CREAM | Freq: Two times a day (BID) | CUTANEOUS | Status: DC

## 2014-02-06 MED ORDER — DOXYCYCLINE HYCLATE 100 MG PO CAPS: 100.0000 mg | ORAL_CAPSULE | Freq: Two times a day (BID) | ORAL | Status: DC

## 2014-02-07 LAB — CBC WITH DIFFERENTIAL/PLATELET
Basophils Absolute: 0 10*3/uL (ref 0.0–0.1)
Basophils Relative: 0 % (ref 0–1)
Eosinophils Absolute: 0.1 10*3/uL (ref 0.0–0.7)
Eosinophils Relative: 2 % (ref 0–5)
HCT: 41.2 % (ref 36.0–46.0)
Hemoglobin: 14.3 g/dL (ref 12.0–15.0)
Lymphocytes Relative: 23 % (ref 12–46)
Lymphs Abs: 1.6 10*3/uL (ref 0.7–4.0)
MCH: 31.2 pg (ref 26.0–34.0)
MCHC: 34.7 g/dL (ref 30.0–36.0)
MCV: 89.8 fL (ref 78.0–100.0)
Monocytes Absolute: 0.5 10*3/uL (ref 0.1–1.0)
Monocytes Relative: 7 % (ref 3–12)
Neutro Abs: 4.8 10*3/uL (ref 1.7–7.7)
Neutrophils Relative %: 68 % (ref 43–77)
Platelets: 273 10*3/uL (ref 150–400)
RBC: 4.59 MIL/uL (ref 3.87–5.11)
RDW: 12.9 % (ref 11.5–15.5)
WBC: 7 10*3/uL (ref 4.0–10.5)

## 2014-02-07 LAB — COMPREHENSIVE METABOLIC PANEL
ALT: 10 U/L (ref 0–35)
AST: 15 U/L (ref 0–37)
Albumin: 4.1 g/dL (ref 3.5–5.2)
Alkaline Phosphatase: 44 U/L (ref 39–117)
BUN: 18 mg/dL (ref 6–23)
CO2: 28 mEq/L (ref 19–32)
Calcium: 9.5 mg/dL (ref 8.4–10.5)
Chloride: 103 mEq/L (ref 96–112)
Creat: 0.93 mg/dL (ref 0.50–1.10)
Glucose, Bld: 104 mg/dL — ABNORMAL HIGH (ref 70–99)
Potassium: 4.6 mEq/L (ref 3.5–5.3)
Sodium: 138 mEq/L (ref 135–145)
Total Bilirubin: 0.3 mg/dL (ref 0.2–1.2)
Total Protein: 6.8 g/dL (ref 6.0–8.3)

## 2014-02-07 LAB — LIPID PANEL
Cholesterol: 205 mg/dL — ABNORMAL HIGH (ref 0–200)
HDL: 45 mg/dL (ref >39)
LDL Cholesterol: 123 mg/dL — ABNORMAL HIGH (ref 0–99)
Total CHOL/HDL Ratio: 4.6 Ratio
Triglycerides: 183 mg/dL — ABNORMAL HIGH (ref <150)
VLDL: 37 mg/dL (ref 0–40)

## 2014-02-07 LAB — IRON: Iron: 114 ug/dL (ref 42–145)

## 2014-02-07 NOTE — Progress Notes (Signed)
   Subjective:    Patient ID: Nicole Jefferson, female    DOB: April 28, 1961, 53 y.o.   MRN: [id number]  HPI 53 year old female presents today for evaluation of a tick bite on her right posterior shoulder. States her husband pulled the tick off about 4 weeks ago.  She has continued to have pruritis and irritation at the site.  Has not noticed any surrounding rash. No drainage from the lesion. Does believe it has "gone down" somewhat. She hast not tried any OTC treatments for this.  Denies nausea, vomiting, abdominal pain, headaches, body aches, fatigue, fever, or chills.   Also requesting cholesterol and iron to be checked. Hx of iron deficiency anemia but has not been on iron "for a while."  No fatigue, muscle cramps, or pica.    In addition requesting lipid screen. Last check was at a health fair at work.  Not currently on any lipid lowering agents.  She does have a strong family hx of CAD and sees a Cardiologist q2years for "check up."    Review of Systems  Constitutional: Negative for fever and chills.  Eyes: Negative for visual disturbance.  Cardiovascular: Negative for chest pain.  Gastrointestinal: Negative for nausea, vomiting and abdominal pain.  Neurological: Negative for headaches.       Objective:   Physical Exam  Constitutional: She is oriented to person, place, and time. She appears well-developed and well-nourished.  HENT:  Head: Normocephalic and atraumatic.  Right Ear: External ear normal.  Left Ear: External ear normal.  Eyes: Conjunctivae are normal.  Neck: Normal range of motion. Neck supple.  Cardiovascular: Normal rate.   Pulmonary/Chest: Effort normal.  Neurological: She is alert and oriented to person, place, and time.  Skin:     Noted area has erythematous bump with scabbed area. No surrounding cellulitis or target lesion.  No evidence of imbedded tick, purulent drainage or warmth.   Psychiatric: She has a normal mood and affect. Her behavior is normal.  Judgment and thought content normal.          Assessment & Plan:  History of iron deficiency - Plan: Iron, CBC with Differential  Other and unspecified hyperlipidemia - Plan: Lipid panel, CBC with Differential  Screening for diabetes mellitus - Plan: Comprehensive metabolic panel  Tick bite - Plan: doxycycline (VIBRAMYCIN) 100 MG capsule  Rash and nonspecific skin eruption - Plan: triamcinolone cream (KENALOG) 0.1 %  Will check labs Plan to treat tick bit for possible secondary infection with doxycycline 100 mg bid x 10 days TAC cream twice daily to affected area to help with pruritis and inflammation.  RTC precautions discussed Recommend she schedule CPE at 104

## 2014-10-09 ENCOUNTER — Other Ambulatory Visit (HOSPITAL_COMMUNITY): Payer: Self-pay | Admitting: Obstetrics and Gynecology

## 2014-10-09 DIAGNOSIS — Z1231 Encounter for screening mammogram for malignant neoplasm of breast: Secondary | ICD-10-CM

## 2014-10-17 ENCOUNTER — Ambulatory Visit (HOSPITAL_COMMUNITY)
Admission: RE | Admit: 2014-10-17 | Discharge: 2014-10-17 | Disposition: A | Discharge status: Home / Self Care | Payer: 59 | Source: Ambulatory Visit | Attending: Obstetrics and Gynecology | Admitting: Obstetrics and Gynecology

## 2014-10-17 DIAGNOSIS — Z1231 Encounter for screening mammogram for malignant neoplasm of breast: Secondary | ICD-10-CM | POA: Diagnosis present

## 2014-10-19 ENCOUNTER — Other Ambulatory Visit: Payer: Self-pay | Admitting: Obstetrics and Gynecology

## 2014-10-19 DIAGNOSIS — R928 Other abnormal and inconclusive findings on diagnostic imaging of breast: Secondary | ICD-10-CM

## 2014-10-31 ENCOUNTER — Other Ambulatory Visit: Payer: Self-pay | Admitting: Obstetrics and Gynecology

## 2014-10-31 ENCOUNTER — Ambulatory Visit
Admission: RE | Admit: 2014-10-31 | Discharge: 2014-10-31 | Disposition: A | Discharge status: Home / Self Care | Payer: 59 | Source: Ambulatory Visit | Attending: Obstetrics and Gynecology | Admitting: Obstetrics and Gynecology

## 2014-10-31 DIAGNOSIS — R928 Other abnormal and inconclusive findings on diagnostic imaging of breast: Secondary | ICD-10-CM

## 2015-05-02 ENCOUNTER — Ambulatory Visit (INDEPENDENT_AMBULATORY_CARE_PROVIDER_SITE_OTHER): Payer: 59 | Admitting: Physician Assistant

## 2015-05-02 VITALS — BP 124/80 | HR 74 | Temp 98.3°F | Resp 18 | Ht 65.0 in | Wt 222.0 lb

## 2015-05-02 DIAGNOSIS — R05 Cough: Secondary | ICD-10-CM | POA: Diagnosis not present

## 2015-05-02 DIAGNOSIS — R058 Other specified cough: Secondary | ICD-10-CM

## 2015-05-02 MED ORDER — BENZONATATE 100 MG PO CAPS: 100.0000 mg | ORAL_CAPSULE | Freq: Three times a day (TID) | ORAL | Status: DC | PRN

## 2015-05-02 NOTE — Progress Notes (Signed)
   Subjective:    Patient ID: Nicole Jefferson, female    DOB: 02/11/1961, 54 y.o.   MRN: [id number]  HPI Patient presents for cough that has been present for 1 month. Was dx with bronchitis over 1 month ago and congestion and SOB have resolved, but dry cough has remained. Cough is throughout day and worse when she is at work as she sit under the [facility] vent. Denies fever, sinus pressure, otalgia, or CP. No allergies. No known sick contacts. NKDA.   Review of Systems As noted above.    Objective:   Physical Exam  Constitutional: She is oriented to person, place, and time. She appears well-developed and well-nourished. No distress.  Blood pressure 124/80, pulse 74, temperature 98.3 F (36.8 C), temperature source Oral, resp. rate 18, height  (1.651 m), weight 222 lb (100.699 kg), SpO2 98 %.   HENT:  Head: Normocephalic and atraumatic.  Right Ear: Tympanic membrane, external ear and ear canal normal.  Left Ear: Tympanic membrane, external ear and ear canal normal.  Nose: No rhinorrhea. Right sinus exhibits no maxillary sinus tenderness and no frontal sinus tenderness. Left sinus exhibits no maxillary sinus tenderness and no frontal sinus tenderness.  Mouth/Throat: Uvula is midline, oropharynx is clear and moist and mucous membranes are normal. No oropharyngeal exudate, posterior oropharyngeal edema or posterior oropharyngeal erythema.  Eyes: Conjunctivae are normal. Pupils are equal, round, and reactive to light. Right eye exhibits no discharge. Left eye exhibits no discharge. No scleral icterus.  Neck: Normal range of motion. Neck supple. No thyromegaly present.  Cardiovascular: Normal rate, regular rhythm and normal heart sounds.  Exam reveals no gallop and no friction rub.   No murmur heard. Pulmonary/Chest: Effort normal and breath sounds normal. No respiratory distress. She has no decreased breath sounds. She has no wheezes. She has no rhonchi. She has no rales.  Audible dry cough  during exam  Abdominal: Soft. Bowel sounds are normal. She exhibits no distension. There is no tenderness. There is no rebound and no guarding.  Lymphadenopathy:    She has no cervical adenopathy.  Neurological: She is alert and oriented to person, place, and time.  Skin: Skin is warm and dry. No rash noted. She is not diaphoretic. No erythema.       Assessment & Plan:  1. Post-viral cough syndrome Anticipatory guidance discussed. Can also use OTC cepacol for additional relief.  - benzonatate (TESSALON) 100 MG capsule; Take 1-2 capsules (100-200 mg total) by mouth 3 (three) times daily as needed for cough.  Dispense: 40 capsule; Refill: 0   Suzanne Garbers PA-C  [facility] 05/02/2015 8:41 PM

## 2016-02-20 ENCOUNTER — Ambulatory Visit (INDEPENDENT_AMBULATORY_CARE_PROVIDER_SITE_OTHER): Payer: 59 | Admitting: Cardiology

## 2016-02-20 ENCOUNTER — Encounter: Payer: Self-pay | Admitting: Cardiology

## 2016-02-20 VITALS — BP 138/84 | HR 57 | Ht 65.0 in | Wt 222.0 lb

## 2016-02-20 DIAGNOSIS — R030 Elevated blood-pressure reading, without diagnosis of hypertension: Secondary | ICD-10-CM | POA: Diagnosis not present

## 2016-02-20 DIAGNOSIS — E785 Hyperlipidemia, unspecified: Secondary | ICD-10-CM | POA: Diagnosis not present

## 2016-02-20 NOTE — Progress Notes (Signed)
   [name] Date of Birth: 17-Feb-1961 Medical Record #[id number]  History of Present Illness: Mrs. Nicole Jefferson is seen today for evaluation of elevated BP. She is a pleasant 55 year-old white female with a family history of CAD. Her father had coronary bypass surgery in his 450s and she had a brother who had a myocardial infarction in his 6840s. She has a history of borderline hypertension and dyslipidemia. She had a normal stress Myoview study in 2012.   She works for [employer]. She states she does a lot of walking and enjoys going on hikes. Admits her diet is still poor. Recently when seen by Gyn BP was elevated at 177 systolic. States BP was elevated for 2-3 weeks. Does note a mild "flutter" at times that makes her cough. Also notes a ringing in her ear. She is on no medications.   No current outpatient prescriptions on file prior to visit.   No current facility-administered medications on file prior to visit.    No Known Allergies  Past Medical History  Diagnosis Date  . Palpitations   . Obese   . Borderline hyperlipidemia   . Borderline systolic HTN   . Colon polyp     Past Surgical History  Procedure Laterality Date  . Tonsillectomy    . [place] echocardiography  08/16/07    stress echo negative  . 48 hr monitor  01/04/09    no significant arrhythmia  . Endovenous ablation saphenous vein w/ laser  04/28/2012    History  Smoking status  . Never Smoker   Smokeless tobacco  . Never Used    History  Alcohol Use No    Family History  Problem Relation Age of Onset  . Heart disease Father   . Cancer Father   . Heart attack Brother     5/12  . Alzheimer's disease Maternal Grandmother   . Stroke Maternal Grandfather   . Stroke Paternal Grandfather     Review of Systems: As noted in history of present illness.  All other systems were reviewed and are negative.  Physical Exam: BP 138/84 mmHg  Pulse 57  Ht 5\' 5"  (1.651 m)  Wt 222 lb (100.699 kg)  BMI 36.94  kg/m2 She is an overweight white female in no acute distress. HEENT: Normal Neck: No JVD or adenopathy. No bruits. Lungs: Clear Cardiovascular: Regular rate and rhythm without gallop, murmur, or click. Abdomen: Soft and nontender. No masses or bruits. Extremities: No cyanosis or edema. Pulses are 2+ and symmetric. Neuro: Alert and oriented x3. Cranial nerves II through XII are intact.  LABORATORY DATA: ECG today demonstrates normal sinus rhythm with rate 57. Poor R wave progression in anterior precordial leads due to body habitus otherwise normal ECG. I have personally reviewed and interpreted this study.   Assessment / Plan: 1. Borderline hypertension. BP improved today. I don't think she needs antihypertensive therapy at this point. I would focus on lifestyle modification with weight loss. Continue regular exercise. Diet to focus on fresh fruits and vegetables with healthy fats. Limit sugar and simple carbohydrate intake. Limit salt. Monitor BP and if it stays high may need therapy in the future. Will follow up with primary MD.  2. Mild dyslipidemia. See #1 for dietary recommendations.   3. Obesity.  4. Family history of early coronary disease.  Follow up prn

## 2016-02-20 NOTE — Patient Instructions (Signed)
Continue regular exercise  Work on [employer]your diet. Goal is to lose weight so restrict your sugar intake and simple carbs like potatoes, rice, pasta, and bread. Eat less processed food  I will see you as needed.

## 2016-06-04 ENCOUNTER — Ambulatory Visit (INDEPENDENT_AMBULATORY_CARE_PROVIDER_SITE_OTHER): Payer: 59 | Admitting: Physician Assistant

## 2016-06-04 VITALS — BP 156/100 | HR 74 | Temp 98.8°F | Resp 16 | Ht 64.0 in | Wt 224.0 lb

## 2016-06-04 DIAGNOSIS — I10 Essential (primary) hypertension: Secondary | ICD-10-CM | POA: Diagnosis not present

## 2016-06-04 MED ORDER — LISINOPRIL-HYDROCHLOROTHIAZIDE 10-12.5 MG PO TABS: 0.5000 | ORAL_TABLET | Freq: Every day | ORAL | 1 refills | Status: DC

## 2016-06-04 NOTE — Patient Instructions (Signed)
Take a half tab for one week and monitor your BP daily.  If your BP is greater than 140/90 after 1 week of medication start taking the full tab.  Please call me if you have to increase the dose so I can ensure you have enough medication.

## 2016-06-04 NOTE — Progress Notes (Signed)
06/06/2016 1:00 PM   DOB: 1961/09/03 / MRN: [id number]  SUBJECTIVE:  Nicole Jefferson is a 55 y.o. female presenting for hypertension.  She has a history of prehypertension.  States that she was at a health fair last week and her pressure registered 180/100.  She has been checking it since and it remains high.  She denies symptoms at this time (see ROS).  She has No Known Allergies.   She  has a past medical history of Borderline hyperlipidemia; Borderline systolic HTN; Colon polyp; Obese; and Palpitations.    She  reports that she has never smoked. She has never used smokeless tobacco. She reports that she does not drink alcohol or use drugs. She  reports that she currently engages in sexual activity. She reports using the following method of birth control/protection: Pill. The patient  has a past surgical history that includes Tonsillectomy; US ECHOCARDIOGRAPHY (08/16/07); 48 hr monitor (01/04/09); and Endovenous ablation saphenous vein w/ laser (04/28/2012).  Her family history includes Alzheimer's disease in her maternal grandmother; Cancer in her father; Heart attack in her brother; Heart disease in her father; Stroke in her maternal grandfather and paternal grandfather.  Review of Systems  Constitutional: Negative for fever.  Eyes: Negative for blurred vision.  Respiratory: Negative for shortness of breath.   Cardiovascular: Negative for chest pain and leg swelling.  Gastrointestinal: Negative for nausea.  Neurological: Negative for dizziness and headaches.    The problem list and medications were reviewed and updated by myself where necessary and exist elsewhere in the encounter.   OBJECTIVE:  BP (!) 156/100 (BP Location: Left Arm, Patient Position: Sitting, Cuff Size: Normal)   Pulse 74   Temp 98.8 F (37.1 C) (Oral)   Resp 16   Ht 5\' 4"  (1.626 m)   Wt 224 lb (101.6 kg)   SpO2 97%   BMI 38.45 kg/m   Physical Exam  Constitutional: She is oriented to person, place, and  time. She appears well-nourished. No distress.  Eyes: EOM are normal. Pupils are equal, round, and reactive to light.  Cardiovascular: Normal rate, regular rhythm and normal heart sounds.   Pulmonary/Chest: Effort normal.  Abdominal: She exhibits no distension.  Musculoskeletal: Normal range of motion. She exhibits no edema.  Neurological: She is alert and oriented to person, place, and time. No cranial nerve deficit. Gait normal.  Skin: Skin is dry. She is not diaphoretic.  Psychiatric: She has a normal mood and affect.  Vitals reviewed.   Results for orders placed or performed in visit on 06/04/16 (from the past 72 hour(s))  TSH     Status: None   Collection Time: 06/04/16  5:44 PM  Result Value Ref Range   TSH 2.40 mIU/L    Comment:   Reference Range   > or = 20 Years  0.40-4.50   Pregnancy Range First trimester  0.26-2.66 Second trimester 0.55-2.73 Third trimester  0.43-2.91     CBC     Status: None   Collection Time: 06/04/16  5:44 PM  Result Value Ref Range   WBC 6.2 3.8 - 10.8 K/uL   RBC 4.67 3.80 - 5.10 MIL/uL   Hemoglobin 14.6 11.7 - 15.5 g/dL   HCT 40.942.4 81.135.0 - 91.445.0 %   MCV 90.8 80.0 - 100.0 fL   MCH 31.3 27.0 - 33.0 pg   MCHC 34.4 32.0 - 36.0 g/dL   RDW 78.212.7 95.611.0 - 21.315.0 %   Platelets 240 140 - 400 K/uL   MPV  10.1 7.5 - 12.5 fL  Basic metabolic panel     Status: Abnormal   Collection Time: 06/04/16  5:44 PM  Result Value Ref Range   Sodium 140 135 - 146 mmol/L   Potassium 4.2 3.5 - 5.3 mmol/L   Chloride 102 98 - 110 mmol/L   CO2 27 20 - 31 mmol/L   Glucose, Bld 102 (H) 65 - 99 mg/dL   BUN 21 7 - 25 mg/dL   Creat 1.61 0.96 - 0.45 mg/dL    Comment:   For patients > or = 56 years of age: The upper reference limit for Creatinine is approximately 13% higher for people identified as African-American.      Calcium 9.6 8.6 - 10.4 mg/dL    No results found.  ASSESSMENT AND PLAN  Nicole Jefferson was seen today for hypertension.  Diagnoses and all orders for this  visit:  Essential hypertension: She is asymptomatic. Initial work up negative.  Guidance provided via AVS.  RTC in 6 months for recheck. -     TSH -     CBC -     Basic metabolic panel  Other orders -     lisinopril-hydrochlorothiazide (PRINZIDE,ZESTORETIC) 10-12.5 MG tablet; Take 0.5 tablets by mouth daily.    The patient is advised to call or return to clinic if she does not see an improvement in symptoms, or to seek the care of the closest emergency department if she worsens with the above plan.   Deliah Boston, MHS, PA-C [facility] 06/06/2016 1:00 PM

## 2016-06-05 LAB — CBC
HCT: 42.4 % (ref 35.0–45.0)
Hemoglobin: 14.6 g/dL (ref 11.7–15.5)
MCH: 31.3 pg (ref 27.0–33.0)
MCHC: 34.4 g/dL (ref 32.0–36.0)
MCV: 90.8 fL (ref 80.0–100.0)
MPV: 10.1 fL (ref 7.5–12.5)
PLATELETS: 240 10*3/uL (ref 140–400)
RBC: 4.67 MIL/uL (ref 3.80–5.10)
RDW: 12.7 % (ref 11.0–15.0)
WBC: 6.2 10*3/uL (ref 3.8–10.8)

## 2016-06-05 LAB — BASIC METABOLIC PANEL
BUN: 21 mg/dL (ref 7–25)
CHLORIDE: 102 mmol/L (ref 98–110)
CO2: 27 mmol/L (ref 20–31)
CREATININE: 0.86 mg/dL (ref 0.50–1.05)
Calcium: 9.6 mg/dL (ref 8.6–10.4)
Glucose, Bld: 102 mg/dL — ABNORMAL HIGH (ref 65–99)
POTASSIUM: 4.2 mmol/L (ref 3.5–5.3)
Sodium: 140 mmol/L (ref 135–146)

## 2016-06-05 LAB — TSH: TSH: 2.4 mIU/L

## 2016-07-18 ENCOUNTER — Other Ambulatory Visit: Payer: Self-pay

## 2016-07-18 MED ORDER — LISINOPRIL-HYDROCHLOROTHIAZIDE 10-12.5 MG PO TABS: 1.0000 | ORAL_TABLET | Freq: Every day | ORAL | 0 refills | Status: DC

## 2016-07-18 MED ORDER — LISINOPRIL-HYDROCHLOROTHIAZIDE 10-12.5 MG PO TABS: 0.5000 | ORAL_TABLET | Freq: Every day | ORAL | 0 refills | Status: DC

## 2016-07-18 NOTE — Telephone Encounter (Signed)
Spoke with pt.  Pt states she was to take 1/2 tab x 1 week and if BP continues high to change to 1 tab per day.  Pt states it continued to be high and she has been taking 1 tab per day.  Pt's last reading 128/88.  Pt advised to keep log with differing times of the day when she takes her BP - bring log to office for follow up in 4 mo - ie January.  Pt agreed.  Discussed with Deliah BostonMichael Clark PA.  Change rx to read take 1 tab per day.   Done

## 2016-07-18 NOTE — Telephone Encounter (Signed)
Received fax for refill of Lisinopril Pharmacy states pt has been taking 1 full tab daily and rx written for 0.5 tab per day.    Discussed with Deliah BostonMichael Clark PA.  He req we call pt, 1)verify she is taking 1 full tab rather than 1/2. 2) has she been checking BP? 3) please keep a log of BP's to bring with her to follow up appt. 4)Has she had any dizzyness, lightheadedness. 5) start taking 1/2 tab - will refill for 60 which will be 120 days of meds - then she is due for recheck her with Deliah BostonMichael Clark PA.

## 2016-08-28 ENCOUNTER — Ambulatory Visit (INDEPENDENT_AMBULATORY_CARE_PROVIDER_SITE_OTHER): Payer: 59 | Admitting: Emergency Medicine

## 2016-08-28 VITALS — BP 122/82 | HR 71 | Temp 98.5°F | Resp 16 | Ht 65.0 in | Wt 221.0 lb

## 2016-08-28 DIAGNOSIS — R350 Frequency of micturition: Secondary | ICD-10-CM

## 2016-08-28 DIAGNOSIS — N3000 Acute cystitis without hematuria: Secondary | ICD-10-CM

## 2016-08-28 DIAGNOSIS — J069 Acute upper respiratory infection, unspecified: Secondary | ICD-10-CM | POA: Diagnosis not present

## 2016-08-28 DIAGNOSIS — I10 Essential (primary) hypertension: Secondary | ICD-10-CM | POA: Diagnosis not present

## 2016-08-28 LAB — POCT URINALYSIS DIP (MANUAL ENTRY)
BILIRUBIN UA: NEGATIVE
BILIRUBIN UA: NEGATIVE
GLUCOSE UA: NEGATIVE
Leukocytes, UA: NEGATIVE
NITRITE UA: NEGATIVE
PH UA: 5.5
RBC UA: NEGATIVE
Urobilinogen, UA: 0.2

## 2016-08-28 LAB — POC MICROSCOPIC URINALYSIS (UMFC): MUCUS RE: ABSENT

## 2016-08-28 MED ORDER — AMOXICILLIN-POT CLAVULANATE 875-125 MG PO TABS: 1.0000 | ORAL_TABLET | Freq: Two times a day (BID) | ORAL | 0 refills | Status: AC

## 2016-08-28 MED ORDER — LISINOPRIL-HYDROCHLOROTHIAZIDE 10-12.5 MG PO TABS: 1.0000 | ORAL_TABLET | Freq: Every day | ORAL | 0 refills | Status: DC

## 2016-08-28 NOTE — Progress Notes (Signed)
Subjective:   c/o several days h/o dysuria and frequency with cold symptoms. Had UTI 2-3 mos ago.   Patient ID: Nicole Jefferson Ourada, female   DOB: 05/20/1961, 55 y.o.   MRN: [id number]  Cough   Dysuria   Associated symptoms include frequency.  Urinary Frequency   Associated symptoms include frequency.  Medication Refill  Associated symptoms include coughing.    Review of Systems  Respiratory: Positive for cough.   Genitourinary: Positive for dysuria and frequency.   Review of Systems - otherwise negative     Objective:   Physical Exam BP 122/82 (BP Location: Right Arm, Patient Position: Sitting, Cuff Size: Large)   Pulse 71   Temp 98.5 F (36.9 C)   Resp 16   Ht 5\' 5"  (1.651 m)   Wt 221 lb (100.2 kg)   LMP 07/05/2016   SpO2 99%   BMI 36.78 kg/m   General Appearance:    Alert, cooperative, no distress, appears stated age  Head:    Normocephalic, without obvious abnormality, atraumatic  Eyes:    PERRL, conjunctiva/corneas clear, EOM's intact, fundi    benign, both eyes  Ears:    Normal TM's and external ear canals, both ears  Nose:   Nares normal, septum midline, mucosa normal, no drainage    or sinus tenderness  Throat:   Lips, mucosa, and tongue normal; teeth and gums normal  Neck:   Supple, symmetrical, trachea midline, no adenopathy;    thyroid:  no enlargement/tenderness/nodules; no carotid   bruit or JVD  Back:     Symmetric, no curvature, ROM normal, no CVA tenderness  Lungs:     Clear to auscultation bilaterally, respirations unlabored  Chest Wall:    No tenderness or deformity   Heart:    Regular rate and rhythm, S1 and S2 normal, no murmur, rub   or gallop  Breast Exam:    No tenderness, masses, or nipple abnormalifour ty  Abdomen:     Soft, non-tender, bowel sounds active all quadrants,    no masses, no organomegaly  Genitalia:    Rectal:    Extremities:   Extremities normal, atraumatic, no cyanosis or edema  Pulses:   2+ and symmetric all extremities   Skin:   Skin color, texture, turgor normal, no rashes or lesions  Lymph nodes:   Cervical, supraclavicular, and axillary nodes normal  Neurologic:   CNII-XII intact, normal strength, sensation and reflexes    throughout        Assessment:    Chip BoerVicki was seen today for cough, nasal congestion, dysuria, urinary frequency and medication refill.  Diagnoses and all orders for this visit:  Urinary frequency -     POCT Microscopic Urinalysis ([facility]) -     POCT urinalysis dipstick -     Urine culture -     Care order/instruction:  Acute cystitis without hematuria -     Care order/instruction:  Acute upper respiratory infection -     Care order/instruction:  Essential hypertension -     Care order/instruction:  Other orders -     lisinopril-hydrochlorothiazide (PRINZIDE,ZESTORETIC) 10-12.5 MG tablet; Take 1 tablet by mouth daily. -     amoxicillin-clavulanate (AUGMENTIN) 875-125 MG tablet; Take 1 tablet by mouth 2 (two) times daily.       Plan:    UTI/URI instructions given. Meds prescribed as above stated.Return if worse. See AVS summary.

## 2016-08-28 NOTE — Patient Instructions (Addendum)
   IF you received an x-ray today, you will receive an invoice from [facility]. Please contact [facility] at [phone] with questions or concerns regarding your invoice.   IF you received labwork today, you will receive an invoice from [facility]. Please contact [facility] at [phone] with questions or concerns regarding your invoice.   Our billing staff will not be able to assist you with questions regarding bills from these companies.  You will be contacted with the lab results as soon as they are available. The fastest way to get your results is to activate your My Chart account. Instructions are located on the last page of this paperwork. If you have not heard from us regarding the results in 2 weeks, please contact this office.      Upper Respiratory Infection, Adult Most upper respiratory infections (URIs) are a viral infection of the air passages leading to the lungs. A URI affects the nose, throat, and upper air passages. The most common type of URI is nasopharyngitis and is typically referred to as "the common cold." URIs run their course and usually go away on their own. Most of the time, a URI does not require medical attention, but sometimes a bacterial infection in the upper airways can follow a viral infection. This is called a secondary infection. Sinus and middle ear infections are common types of secondary upper respiratory infections. Bacterial pneumonia can also complicate a URI. A URI can worsen asthma and chronic obstructive pulmonary disease (COPD). Sometimes, these complications can require emergency medical care and may be life threatening. What are the causes? Almost all URIs are caused by viruses. A virus is a type of germ and can spread from one person to another. What increases the risk? You may be at risk for a URI if:  You smoke.  You have chronic heart or lung disease.  You have a weakened defense (immune) system.  You are very  young or very old.  You have nasal allergies or asthma.  You work in crowded or poorly ventilated areas.  You work in health care facilities or schools. What are the signs or symptoms? Symptoms typically develop 2-3 days after you come in contact with a cold virus. Most viral URIs last 7-10 days. However, viral URIs from the influenza virus (flu virus) can last 14-18 days and are typically more severe. Symptoms may include:  Runny or stuffy (congested) nose.  Sneezing.  Cough.  Sore throat.  Headache.  Fatigue.  Fever.  Loss of appetite.  Pain in your forehead, behind your eyes, and over your cheekbones (sinus pain).  Muscle aches. How is this diagnosed? Your health care provider may diagnose a URI by:  Physical exam.  Tests to check that your symptoms are not due to another condition such as:  Strep throat.  Sinusitis.  Pneumonia.  Asthma. How is this treated? A URI goes away on its own with time. It cannot be cured with medicines, but medicines may be prescribed or recommended to relieve symptoms. Medicines may help:  Reduce your fever.  Reduce your cough.  Relieve nasal congestion. Follow these instructions at home:  Take medicines only as directed by your health care provider.  Gargle warm saltwater or take cough drops to comfort your throat as directed by your health care provider.  Use a warm mist humidifier or inhale steam from a shower to increase air moisture. This may make it easier to breathe.  Drink enough fluid to keep your urine clear   or pale yellow.  Eat soups and other clear broths and maintain good nutrition.  Rest as needed.  Return to work when your temperature has returned to normal or as your health care provider advises. You may need to stay home longer to avoid infecting others. You can also use a face mask and careful hand washing to prevent spread of the virus.  Increase the usage of your inhaler if you have asthma.  Do not  use any tobacco products, including cigarettes, chewing tobacco, or electronic cigarettes. If you need help quitting, ask your health care provider. How is this prevented? The best way to protect yourself from getting a cold is to practice good hygiene.  Avoid oral or hand contact with people with cold symptoms.  Wash your hands often if contact occurs. There is no clear evidence that vitamin C, vitamin E, echinacea, or exercise reduces the chance of developing a cold. However, it is always recommended to get plenty of rest, exercise, and practice good nutrition. Contact a health care provider if:  You are getting worse rather than better.  Your symptoms are not controlled by medicine.  You have chills.  You have worsening shortness of breath.  You have brown or red mucus.  You have yellow or brown nasal discharge.  You have pain in your face, especially when you bend forward.  You have a fever.  You have swollen neck glands.  You have pain while swallowing.  You have white areas in the back of your throat. Get help right away if:  You have severe or persistent:  Headache.  Ear pain.  Sinus pain.  Chest pain.  You have chronic lung disease and any of the following:  Wheezing.  Prolonged cough.  Coughing up blood.  A change in your usual mucus.  You have a stiff neck.  You have changes in your:  Vision.  Hearing.  Thinking.  Mood. This information is not intended to replace advice given to you by your health care provider. Make sure you discuss any questions you have with your health care provider. Document Released: 02/18/2001 Document Revised: 04/27/2016 Document Reviewed: 11/30/2013 Elsevier Interactive Patient Education  2017 [employer]vier Inc.  Urinary Tract Infection, Adult Introduction A urinary tract infection (UTI) is an infection of any part of the urinary tract. The urinary tract includes  the:  Kidneys.  Ureters.  Bladder.  Urethra. These organs make, store, and get rid of pee (urine) in the body. Follow these instructions at home:  Take over-the-counter and prescription medicines only as told by your doctor.  If you were prescribed an antibiotic medicine, take it as told by your doctor. Do not stop taking the antibiotic even if you start to feel better.  Avoid the following drinks:  Alcohol.  Caffeine.  Tea.  Carbonated drinks.  Drink enough fluid to keep your pee clear or pale yellow.  Keep all follow-up visits as told by your doctor. This is important.  Make sure to:  Empty your bladder often and completely. Do not to hold pee for long periods of time.  Empty your bladder before and after sex.  Wipe from front to back after a bowel movement if you are female. Use each tissue one time when you wipe. Contact a doctor if:  You have back pain.  You have a fever.  You feel sick to your stomach (nauseous).  You throw up (vomit).  Your symptoms do not get better after 3 days.  Your symptoms go  away and then come back. Get help right away if:  You have very bad back pain.  You have very bad lower belly (abdominal) pain.  You are throwing up and cannot keep down any medicines or water. This information is not intended to replace advice given to you by your health care provider. Make sure you discuss any questions you have with your health care provider. Document Released: 02/11/2008 Document Revised: 01/31/2016 Document Reviewed: 07/16/2015  2017 Elsevier

## 2016-08-30 LAB — URINE CULTURE

## 2017-01-31 ENCOUNTER — Encounter: Payer: Self-pay | Admitting: Family Medicine

## 2017-01-31 ENCOUNTER — Ambulatory Visit (INDEPENDENT_AMBULATORY_CARE_PROVIDER_SITE_OTHER): Payer: 59 | Admitting: Family Medicine

## 2017-01-31 VITALS — BP 136/83 | HR 74 | Temp 98.0°F | Resp 16 | Ht 64.0 in | Wt 221.0 lb

## 2017-01-31 DIAGNOSIS — I10 Essential (primary) hypertension: Secondary | ICD-10-CM

## 2017-01-31 DIAGNOSIS — Z1159 Encounter for screening for other viral diseases: Secondary | ICD-10-CM | POA: Diagnosis not present

## 2017-01-31 MED ORDER — LISINOPRIL-HYDROCHLOROTHIAZIDE 10-12.5 MG PO TABS: 1.0000 | ORAL_TABLET | Freq: Every day | ORAL | 1 refills | Status: DC

## 2017-01-31 NOTE — Progress Notes (Signed)
  Chief Complaint  Patient presents with  . Medication Refill    Lisinopril-HCTZ    HPI  .Hypertension: Patient here for follow-up of elevated blood pressure. She is exercising and is adherent to low salt diet. She had some musculoskeletal pain which slowed her exercise program. Blood pressure is well controlled at home. Cardiac symptoms none. Patient denies chest pain, dyspnea, irregular heart beat, lower extremity edema, palpitations and syncope.  Cardiovascular risk factors: hypertension.  BP Readings from Last 3 Encounters:  01/31/17 136/83  08/28/16 122/82  06/04/16 (!) 156/100    Past Medical History:  Diagnosis Date  . Borderline hyperlipidemia   . Borderline systolic HTN   . Colon polyp   . Obese   . Palpitations     Current Outpatient Prescriptions  Medication Sig Dispense Refill  . lisinopril-hydrochlorothiazide (PRINZIDE,ZESTORETIC) 10-12.5 MG tablet Take 1 tablet by mouth daily. 90 tablet 1   No current facility-administered medications for this visit.     Allergies: No Known Allergies  Past Surgical History:  Procedure Laterality Date  . 48 hr monitor  01/04/09   no significant arrhythmia  . ENDOVENOUS ABLATION SAPHENOUS VEIN W/ LASER  04/28/2012  . TONSILLECTOMY    . US ECHOCARDIOGRAPHY  08/16/07   stress echo negative    Social History   Social History  . Marital status: Married    Spouse name: N/A  . Number of children: 4  . Years of education: N/A   Occupational History  . accounts payable    Social History Main Topics  . Smoking status: Never Smoker  . Smokeless tobacco: Never Used  . Alcohol use No  . Drug use: No  . Sexual activity: Yes    Birth control/ protection: Pill   Other Topics Concern  . None   Social History Narrative  . None    ROS See hpi  Objective: Vitals:   01/31/17 1548  BP: 136/83  Pulse: 74  Resp: 16  Temp: 98 F (36.7 C)  TempSrc: Oral  SpO2: 100%  Weight: 221 lb (100.2 kg)  Height: 5\' 4"  (1.626  m)    Physical Exam  Constitutional: She is oriented to person, place, and time. She appears well-developed and well-nourished.  HENT:  Head: Normocephalic and atraumatic.  Cardiovascular: Normal rate, regular rhythm and normal heart sounds.   Pulmonary/Chest: Effort normal and breath sounds normal. No respiratory distress. She has no wheezes.  Musculoskeletal: Normal range of motion. She exhibits no edema.  Neurological: She is alert and oriented to person, place, and time.    Assessment and Plan Nicole BoerVicki was seen today for medication refill.  Diagnoses and all orders for this visit:  Essential hypertension- bp at goal, will assess renal function -     Basic metabolic panel  Encounter for hepatitis C screening test for low risk patient -     HCV Ab w/Rflx to Verification  Other orders -     lisinopril-hydrochlorothiazide (PRINZIDE,ZESTORETIC) 10-12.5 MG tablet; Take 1 tablet by mouth daily. -     HCV Ab w/Rflx to Verification -     Interpretation:     [name]

## 2017-01-31 NOTE — Patient Instructions (Addendum)
   IF you received an x-ray today, you will receive an invoice from [facility]. Please contact [facility] at [phone] with questions or concerns regarding your invoice.   IF you received labwork today, you will receive an invoice from [facility]. Please contact [facility] at [phone] with questions or concerns regarding your invoice.   Our billing staff will not be able to assist you with questions regarding bills from these companies.  You will be contacted with the lab results as soon as they are available. The fastest way to get your results is to activate your My Chart account. Instructions are located on the last page of this paperwork. If you have not heard from us regarding the results in 2 weeks, please contact this office.      Hypertension Hypertension is another name for high blood pressure. High blood pressure forces your heart to work harder to pump blood. This can cause problems over time. There are two numbers in a blood pressure reading. There is a top number (systolic) over a bottom number (diastolic). It is best to have a blood pressure below 120/80. Healthy choices can help lower your blood pressure. You may need medicine to help lower your blood pressure if:  Your blood pressure cannot be lowered with healthy choices.  Your blood pressure is higher than 130/80. Follow these instructions at home: Eating and drinking   If directed, follow the DASH eating plan. This diet includes:  Filling half of your plate at each meal with fruits and vegetables.  Filling one quarter of your plate at each meal with whole grains. Whole grains include whole wheat pasta, brown rice, and whole grain bread.  Eating or drinking low-fat dairy products, such as skim milk or low-fat yogurt.  Filling one quarter of your plate at each meal with low-fat (lean) proteins. Low-fat proteins include fish, skinless chicken, eggs, beans, and tofu.  Avoiding fatty meat,  cured and processed meat, or chicken with skin.  Avoiding premade or processed food.  Eat less than 1,500 mg of salt (sodium) a day.  Limit alcohol use to no more than 1 drink a day for nonpregnant women and 2 drinks a day for men. One drink equals 12 oz of beer, 5 oz of wine, or 1 oz of hard liquor. Lifestyle   Work with your doctor to stay at a healthy weight or to lose weight. Ask your doctor what the best weight is for you.  Get at least 30 minutes of exercise that causes your heart to beat faster (aerobic exercise) most days of the week. This may include walking, swimming, or biking.  Get at least 30 minutes of exercise that strengthens your muscles (resistance exercise) at least 3 days a week. This may include lifting weights or pilates.  Do not use any products that contain nicotine or tobacco. This includes cigarettes and e-cigarettes. If you need help quitting, ask your doctor.  Check your blood pressure at home as told by your doctor.  Keep all follow-up visits as told by your doctor. This is important. Medicines   Take over-the-counter and prescription medicines only as told by your doctor. Follow directions carefully.  Do not skip doses of blood pressure medicine. The medicine does not work as well if you skip doses. Skipping doses also puts you at risk for problems.  Ask your doctor about side effects or reactions to medicines that you should watch for. Contact a doctor if:  You think you are having a   reaction to the medicine you are taking.  You have headaches that keep coming back (recurring).  You feel dizzy.  You have swelling in your ankles.  You have trouble with your vision. Get help right away if:  You get a very bad headache.  You start to feel confused.  You feel weak or numb.  You feel faint.  You get very bad pain in your:  Chest.  Belly (abdomen).  You throw up (vomit) more than once.  You have trouble  breathing. Summary  Hypertension is another name for high blood pressure.  Making healthy choices can help lower blood pressure. If your blood pressure cannot be controlled with healthy choices, you may need to take medicine. This information is not intended to replace advice given to you by your health care provider. Make sure you discuss any questions you have with your health care provider. Document Released: 02/11/2008 Document Revised: 07/23/2016 Document Reviewed: 07/23/2016 Elsevier Interactive Patient Education  2017 Elsevier Inc.  

## 2017-02-01 LAB — BASIC METABOLIC PANEL
BUN / CREAT RATIO: 16 (ref 9–23)
BUN: 18 mg/dL (ref 6–24)
CO2: 28 mmol/L (ref 18–29)
CREATININE: 1.12 mg/dL — AB (ref 0.57–1.00)
Calcium: 9.8 mg/dL (ref 8.7–10.2)
Chloride: 105 mmol/L (ref 96–106)
GFR calc Af Amer: 63 mL/min/{1.73_m2} (ref >59)
GFR, EST NON AFRICAN AMERICAN: 55 mL/min/{1.73_m2} — AB (ref >59)
Glucose: 107 mg/dL — ABNORMAL HIGH (ref 65–99)
Potassium: 4.3 mmol/L (ref 3.5–5.2)
SODIUM: 145 mmol/L — AB (ref 134–144)

## 2017-02-01 LAB — HCV INTERPRETATION

## 2017-02-01 LAB — HCV AB W/RFLX TO VERIFICATION: HCV Ab: <0.1 s/co ratio (ref 0.0–0.9)

## 2017-05-07 ENCOUNTER — Other Ambulatory Visit: Payer: Self-pay | Admitting: Orthopedic Surgery

## 2017-05-07 DIAGNOSIS — M25511 Pain in right shoulder: Secondary | ICD-10-CM

## 2017-05-21 ENCOUNTER — Other Ambulatory Visit: Payer: 59

## 2017-11-30 ENCOUNTER — Other Ambulatory Visit: Payer: Self-pay | Admitting: Family Medicine

## 2017-11-30 DIAGNOSIS — I10 Essential (primary) hypertension: Secondary | ICD-10-CM

## 2017-12-09 ENCOUNTER — Ambulatory Visit: Payer: Self-pay | Admitting: Physician Assistant

## 2017-12-29 ENCOUNTER — Emergency Department (HOSPITAL_BASED_OUTPATIENT_CLINIC_OR_DEPARTMENT_OTHER): Payer: 59

## 2017-12-29 ENCOUNTER — Emergency Department (HOSPITAL_COMMUNITY): Payer: 59 | Admitting: Certified Registered Nurse Anesthetist

## 2017-12-29 ENCOUNTER — Other Ambulatory Visit: Payer: Self-pay

## 2017-12-29 ENCOUNTER — Observation Stay (HOSPITAL_BASED_OUTPATIENT_CLINIC_OR_DEPARTMENT_OTHER)
Admission: EM | Admit: 2017-12-29 | Discharge: 2017-12-30 | Disposition: A | Discharge status: Home / Self Care | Payer: 59 | Attending: Surgery | Admitting: Surgery

## 2017-12-29 ENCOUNTER — Encounter: Payer: Self-pay | Admitting: Physician Assistant

## 2017-12-29 ENCOUNTER — Encounter (HOSPITAL_BASED_OUTPATIENT_CLINIC_OR_DEPARTMENT_OTHER): Payer: Self-pay | Admitting: *Deleted

## 2017-12-29 ENCOUNTER — Ambulatory Visit (INDEPENDENT_AMBULATORY_CARE_PROVIDER_SITE_OTHER): Payer: 59 | Admitting: Physician Assistant

## 2017-12-29 ENCOUNTER — Encounter (HOSPITAL_COMMUNITY)
Admission: EM | Disposition: A | Discharge status: Home / Self Care | Payer: Self-pay | Source: Home / Self Care | Attending: Emergency Medicine

## 2017-12-29 VITALS — BP 124/86 | HR 80 | Ht 64.0 in | Wt 214.4 lb

## 2017-12-29 DIAGNOSIS — Z0000 Encounter for general adult medical examination without abnormal findings: Secondary | ICD-10-CM

## 2017-12-29 DIAGNOSIS — E785 Hyperlipidemia, unspecified: Secondary | ICD-10-CM | POA: Diagnosis not present

## 2017-12-29 DIAGNOSIS — Z79899 Other long term (current) drug therapy: Secondary | ICD-10-CM | POA: Diagnosis not present

## 2017-12-29 DIAGNOSIS — K37 Unspecified appendicitis: Secondary | ICD-10-CM | POA: Diagnosis present

## 2017-12-29 DIAGNOSIS — I10 Essential (primary) hypertension: Secondary | ICD-10-CM

## 2017-12-29 DIAGNOSIS — K3580 Unspecified acute appendicitis: Secondary | ICD-10-CM | POA: Diagnosis not present

## 2017-12-29 DIAGNOSIS — R002 Palpitations: Secondary | ICD-10-CM | POA: Diagnosis not present

## 2017-12-29 DIAGNOSIS — Z6837 Body mass index (BMI) 37.0-37.9, adult: Secondary | ICD-10-CM | POA: Insufficient documentation

## 2017-12-29 DIAGNOSIS — K429 Umbilical hernia without obstruction or gangrene: Secondary | ICD-10-CM | POA: Diagnosis not present

## 2017-12-29 DIAGNOSIS — Z8719 Personal history of other diseases of the digestive system: Secondary | ICD-10-CM | POA: Diagnosis present

## 2017-12-29 HISTORY — PX: LAPAROSCOPIC APPENDECTOMY: SHX408

## 2017-12-29 LAB — COMPREHENSIVE METABOLIC PANEL WITH GFR
ALT: 21 U/L (ref 14–54)
AST: 27 U/L (ref 15–41)
Albumin: 4.1 g/dL (ref 3.5–5.0)
Alkaline Phosphatase: 52 U/L (ref 38–126)
Anion gap: 11 (ref 5–15)
BUN: 21 mg/dL — ABNORMAL HIGH (ref 6–20)
CO2: 27 mmol/L (ref 22–32)
Calcium: 9.4 mg/dL (ref 8.9–10.3)
Chloride: 102 mmol/L (ref 101–111)
Creatinine, Ser: 1.06 mg/dL — ABNORMAL HIGH (ref 0.44–1.00)
GFR calc Af Amer: >60 mL/min
GFR calc non Af Amer: 57 mL/min — ABNORMAL LOW
Glucose, Bld: 124 mg/dL — ABNORMAL HIGH (ref 65–99)
Potassium: 3.8 mmol/L (ref 3.5–5.1)
Sodium: 140 mmol/L (ref 135–145)
Total Bilirubin: 0.3 mg/dL (ref 0.3–1.2)
Total Protein: 7 g/dL (ref 6.5–8.1)

## 2017-12-29 LAB — URINALYSIS, ROUTINE W REFLEX MICROSCOPIC
Bilirubin Urine: NEGATIVE
GLUCOSE, UA: NEGATIVE mg/dL
Ketones, ur: NEGATIVE mg/dL
LEUKOCYTES UA: NEGATIVE
Nitrite: NEGATIVE
PROTEIN: NEGATIVE mg/dL
Specific Gravity, Urine: >1.03 — ABNORMAL HIGH (ref 1.005–1.030)
pH: 5.5 (ref 5.0–8.0)

## 2017-12-29 LAB — URINALYSIS, MICROSCOPIC (REFLEX)

## 2017-12-29 LAB — CBC
HCT: 41.6 % (ref 36.0–46.0)
Hemoglobin: 14.5 g/dL (ref 12.0–15.0)
MCH: 32.1 pg (ref 26.0–34.0)
MCHC: 34.9 g/dL (ref 30.0–36.0)
MCV: 92 fL (ref 78.0–100.0)
Platelets: 269 K/uL (ref 150–400)
RBC: 4.52 MIL/uL (ref 3.87–5.11)
RDW: 12 % (ref 11.5–15.5)
WBC: 7.3 K/uL (ref 4.0–10.5)

## 2017-12-29 LAB — LIPASE, BLOOD: Lipase: 47 U/L (ref 11–51)

## 2017-12-29 SURGERY — APPENDECTOMY, LAPAROSCOPIC
Anesthesia: General | Site: Abdomen

## 2017-12-29 MED ORDER — SUGAMMADEX SODIUM 200 MG/2ML IV SOLN
INTRAVENOUS | Status: AC
  Filled 2017-12-29: qty 2

## 2017-12-29 MED ORDER — PROPOFOL 10 MG/ML IV BOLUS
INTRAVENOUS | Status: AC
  Filled 2017-12-29: qty 20

## 2017-12-29 MED ORDER — 0.9 % SODIUM CHLORIDE (POUR BTL) OPTIME
TOPICAL | Status: DC | PRN
  Administered 2017-12-29: 1000 mL

## 2017-12-29 MED ORDER — LACTATED RINGERS IV SOLN
INTRAVENOUS | Status: DC | PRN
  Administered 2017-12-29: 23:00:00 via INTRAVENOUS

## 2017-12-29 MED ORDER — SUGAMMADEX SODIUM 200 MG/2ML IV SOLN
INTRAVENOUS | Status: DC | PRN
  Administered 2017-12-29: 200 mg via INTRAVENOUS

## 2017-12-29 MED ORDER — SODIUM CHLORIDE 0.9 % IV BOLUS
1000.0000 mL | Freq: Once | INTRAVENOUS | Status: AC
  Administered 2017-12-29: 1000 mL via INTRAVENOUS

## 2017-12-29 MED ORDER — ONDANSETRON HCL 4 MG/2ML IJ SOLN
INTRAMUSCULAR | Status: DC | PRN
  Administered 2017-12-29: 4 mg via INTRAVENOUS

## 2017-12-29 MED ORDER — PROMETHAZINE HCL 25 MG/ML IJ SOLN: 6.2500 mg | INTRAMUSCULAR | Status: DC | PRN

## 2017-12-29 MED ORDER — BUPIVACAINE-EPINEPHRINE (PF) 0.5% -1:200000 IJ SOLN
INTRAMUSCULAR | Status: AC
  Filled 2017-12-29: qty 30

## 2017-12-29 MED ORDER — OXYCODONE HCL 5 MG/5ML PO SOLN
5.0000 mg | Freq: Once | ORAL | Status: DC | PRN
  Filled 2017-12-29: qty 5

## 2017-12-29 MED ORDER — SUCCINYLCHOLINE CHLORIDE 200 MG/10ML IV SOSY
PREFILLED_SYRINGE | INTRAVENOUS | Status: DC | PRN
  Administered 2017-12-29: 100 mg via INTRAVENOUS

## 2017-12-29 MED ORDER — LISINOPRIL-HYDROCHLOROTHIAZIDE 10-12.5 MG PO TABS: 1.0000 | ORAL_TABLET | Freq: Every day | ORAL | 3 refills | Status: DC

## 2017-12-29 MED ORDER — SODIUM CHLORIDE 0.9 % IV SOLN
INTRAVENOUS | Status: DC | PRN
  Administered 2017-12-29: 2 g via INTRAVENOUS

## 2017-12-29 MED ORDER — HYDROMORPHONE HCL 1 MG/ML IJ SOLN
0.2500 mg | INTRAMUSCULAR | Status: DC | PRN
  Administered 2017-12-30 (×3): 0.25 mg via INTRAVENOUS

## 2017-12-29 MED ORDER — LIDOCAINE 2% (20 MG/ML) 5 ML SYRINGE
INTRAMUSCULAR | Status: DC | PRN
  Administered 2017-12-29: 100 mg via INTRAVENOUS

## 2017-12-29 MED ORDER — FENTANYL CITRATE (PF) 100 MCG/2ML IJ SOLN
INTRAMUSCULAR | Status: AC
  Filled 2017-12-29: qty 2

## 2017-12-29 MED ORDER — LACTATED RINGERS IR SOLN
Status: DC | PRN
  Administered 2017-12-29: 1000 mL

## 2017-12-29 MED ORDER — FENTANYL CITRATE (PF) 100 MCG/2ML IJ SOLN
INTRAMUSCULAR | Status: DC | PRN
  Administered 2017-12-29: 50 ug via INTRAVENOUS
  Administered 2017-12-29: 100 ug via INTRAVENOUS

## 2017-12-29 MED ORDER — CEFOTETAN DISODIUM-DEXTROSE 2-2.08 GM-%(50ML) IV SOLR
INTRAVENOUS | Status: AC
  Filled 2017-12-29: qty 50

## 2017-12-29 MED ORDER — BUPIVACAINE-EPINEPHRINE 0.5% -1:200000 IJ SOLN
INTRAMUSCULAR | Status: DC | PRN
  Administered 2017-12-29: 30 mL

## 2017-12-29 MED ORDER — OXYCODONE HCL 5 MG PO TABS: 5.0000 mg | ORAL_TABLET | Freq: Once | ORAL | Status: DC | PRN

## 2017-12-29 MED ORDER — HYDROMORPHONE HCL 1 MG/ML IJ SOLN
INTRAMUSCULAR | Status: AC
  Filled 2017-12-29: qty 1

## 2017-12-29 MED ORDER — PROPOFOL 10 MG/ML IV BOLUS
INTRAVENOUS | Status: DC | PRN
  Administered 2017-12-29: 200 mg via INTRAVENOUS

## 2017-12-29 MED ORDER — ROCURONIUM BROMIDE 10 MG/ML (PF) SYRINGE
PREFILLED_SYRINGE | INTRAVENOUS | Status: DC | PRN
  Administered 2017-12-29: 30 mg via INTRAVENOUS

## 2017-12-29 MED ORDER — DEXAMETHASONE SODIUM PHOSPHATE 4 MG/ML IJ SOLN
INTRAMUSCULAR | Status: DC | PRN
  Administered 2017-12-29: 10 mg via INTRAVENOUS

## 2017-12-29 MED ORDER — IOPAMIDOL (ISOVUE-300) INJECTION 61%
100.0000 mL | Freq: Once | INTRAVENOUS | Status: AC | PRN
  Administered 2017-12-29: 100 mL via INTRAVENOUS

## 2017-12-29 SURGICAL SUPPLY — 33 items
ADH SKN CLS APL DERMABOND .7 (GAUZE/BANDAGES/DRESSINGS) ×1
BAG SPEC RTRVL 10 TROC 200 (ENDOMECHANICALS) ×1
BAG SPEC RTRVL LRG 6X4 10 (ENDOMECHANICALS) ×1
CABLE HIGH FREQUENCY MONO STRZ (ELECTRODE) ×3 IMPLANT
CHLORAPREP W/TINT 26ML (MISCELLANEOUS) ×3 IMPLANT
COVER SURGICAL LIGHT HANDLE (MISCELLANEOUS) ×3 IMPLANT
CUTTER FLEX LINEAR 45M (STAPLE) ×3 IMPLANT
DECANTER SPIKE VIAL GLASS SM (MISCELLANEOUS) ×3 IMPLANT
DERMABOND ADVANCED (GAUZE/BANDAGES/DRESSINGS) ×2
DERMABOND ADVANCED .7 DNX12 (GAUZE/BANDAGES/DRESSINGS) ×1 IMPLANT
DRAPE LAPAROSCOPIC ABDOMINAL (DRAPES) ×3 IMPLANT
ELECT REM PT RETURN 15FT ADLT (MISCELLANEOUS) ×3 IMPLANT
GLOVE BIOGEL PI IND STRL 7.0 (GLOVE) ×2 IMPLANT
GLOVE BIOGEL PI INDICATOR 7.0 (GLOVE) ×4
GLOVE SURG SIGNA 7.5 PF LTX (GLOVE) ×3 IMPLANT
GOWN STRL REUS W/TWL XL LVL3 (GOWN DISPOSABLE) ×6 IMPLANT
KIT BASIN OR (CUSTOM PROCEDURE TRAY) ×3 IMPLANT
POUCH RETRIEVAL ECOSAC 10 (ENDOMECHANICALS) IMPLANT
POUCH RETRIEVAL ECOSAC 10MM (ENDOMECHANICALS) ×2
POUCH SPECIMEN RETRIEVAL 10MM (ENDOMECHANICALS) ×3 IMPLANT
RELOAD STAPLE 45 3.5 BLU ETS (ENDOMECHANICALS) IMPLANT
RELOAD STAPLE TA45 3.5 REG BLU (ENDOMECHANICALS) ×3 IMPLANT
SCISSORS LAP 5X35 DISP (ENDOMECHANICALS) ×3 IMPLANT
SET IRRIG TUBING LAPAROSCOPIC (IRRIGATION / IRRIGATOR) ×3 IMPLANT
SHEARS HARMONIC ACE PLUS 36CM (ENDOMECHANICALS) ×3 IMPLANT
SLEEVE XCEL OPT CAN 5 100 (ENDOMECHANICALS) ×3 IMPLANT
SUT MNCRL AB 4-0 PS2 18 (SUTURE) ×3 IMPLANT
SUT VICRYL 0 UR6 27IN ABS (SUTURE) ×4 IMPLANT
TOWEL OR 17X26 10 PK STRL BLUE (TOWEL DISPOSABLE) ×3 IMPLANT
TOWEL OR NON WOVEN STRL DISP B (DISPOSABLE) ×3 IMPLANT
TRAY LAPAROSCOPIC (CUSTOM PROCEDURE TRAY) ×3 IMPLANT
TROCAR BLADELESS OPT 5 100 (ENDOMECHANICALS) ×3 IMPLANT
TROCAR XCEL BLUNT TIP 100MML (ENDOMECHANICALS) ×3 IMPLANT

## 2017-12-29 NOTE — Anesthesia Preprocedure Evaluation (Addendum)
Anesthesia Evaluation  Patient identified by MRN, date of birth, ID band Patient awake    Reviewed: Allergy & Precautions, NPO status , Patient's Chart, lab work & pertinent test results  Airway Mallampati: II  TM Distance: >3 FB Neck ROM: Full    Dental no notable dental hx.    Pulmonary neg pulmonary ROS,    Pulmonary exam normal breath sounds clear to auscultation       Cardiovascular hypertension, Pt. on medications Normal cardiovascular exam Rhythm:Regular Rate:Normal  Sees cardiologist    Neuro/Psych negative neurological ROS  negative psych ROS   GI/Hepatic negative GI ROS, Neg liver ROS,   Endo/Other  negative endocrine ROS  Renal/GU negative Renal ROS     Musculoskeletal negative musculoskeletal ROS (+)   Abdominal (+) + obese,   Peds  Hematology HLD   Anesthesia Other Findings appendicitis  Reproductive/Obstetrics                            Anesthesia Physical Anesthesia Plan  ASA: II and emergent  Anesthesia Plan: General   Post-op Pain Management:    Induction: Intravenous  PONV Risk Score and Plan: 4 or greater and Midazolam, Dexamethasone, Ondansetron and Treatment may vary due to age or medical condition  Airway Management Planned: Oral ETT  Additional Equipment:   Intra-op Plan:   Post-operative Plan: Extubation in OR  Informed Consent: I have reviewed the patients History and Physical, chart, labs and discussed the procedure including the risks, benefits and alternatives for the proposed anesthesia with the patient or authorized representative who has indicated his/her understanding and acceptance.   Dental advisory given  Plan Discussed with: CRNA  Anesthesia Plan Comments:        Anesthesia Quick Evaluation

## 2017-12-29 NOTE — ED Provider Notes (Signed)
[facility] EMERGENCY DEPARTMENT Provider Note   CSN: [id number] Arrival date & time: 12/29/17  1720     History   Chief Complaint Chief Complaint  Patient presents with  . Abdominal Pain    HPI Nicole Jefferson is a 57 y.o. female with past medical history of hypertension, who presents to ED for evaluation of 18-hour history of generalized abdominal pain, bloating.  She felt out though she needed to pass gas or belch.  She had one episode of NBNB emesis earlier this morning when the pain got worse and is now localized in her right lower quadrant.  She reports 2 normal bowel movements today.  She is concerned because her husband had a similar presentation several months ago and was diagnosed with appendicitis.  She denies any fever, sick contacts with similar symptoms, suspicious food ingestions, prior abdominal surgeries, dysuria, hematuria, shortness of breath.  HPI  Past Medical History:  Diagnosis Date  . Borderline hyperlipidemia   . Borderline systolic HTN   . Colon polyp   . Obese   . Palpitations     Patient Active Problem List   Diagnosis Date Noted  . Family history of colon cancer 10/05/2012  . Venous disease 05/19/2012  . Varicose veins of left lower extremity with inflammation 05/19/2012  . Ganglion cyst of wrist 10/13/2011  . Hyperlipidemia 01/10/2011    Past Surgical History:  Procedure Laterality Date  . 48 hr monitor  01/04/09   no significant arrhythmia  . ENDOVENOUS ABLATION SAPHENOUS VEIN W/ LASER  04/28/2012  . TONSILLECTOMY    . US ECHOCARDIOGRAPHY  08/16/07   stress echo negative     OB History    Gravida  3   Para  3   Term      Preterm      AB      Living  3     SAB      TAB      Ectopic      Multiple      Live Births               Home Medications    Prior to Admission medications   Medication Sig Start Date End Date Taking? Authorizing Provider  lisinopril-hydrochlorothiazide (PRINZIDE,ZESTORETIC)  10-12.5 MG tablet Take 1 tablet by mouth daily. 12/29/17  Yes Azalee CourseMeng, Hao, PA    Family History Family History  Problem Relation Age of Onset  . Heart disease Father   . Cancer Father   . Heart attack Brother        5/12  . Alzheimer's disease Maternal Grandmother   . Stroke Maternal Grandfather   . Stroke Paternal Grandfather     Social History Social History   Tobacco Use  . Smoking status: Never Smoker  . Smokeless tobacco: Never Used  Substance Use Topics  . Alcohol use: No  . Drug use: No     Allergies   Patient has no known allergies.   Review of Systems Review of Systems  Constitutional: Negative for appetite change, chills and fever.  HENT: Negative for ear pain, rhinorrhea, sneezing and sore throat.   Eyes: Negative for photophobia and visual disturbance.  Respiratory: Negative for cough, chest tightness, shortness of breath and wheezing.   Cardiovascular: Negative for chest pain and palpitations.  Gastrointestinal: Positive for abdominal pain, nausea and vomiting. Negative for blood in stool, constipation and diarrhea.  Genitourinary: Negative for dysuria, hematuria and urgency.  Musculoskeletal: Negative for myalgias.  Skin:  Negative for rash.  Neurological: Negative for dizziness, weakness and light-headedness.     Physical Exam Updated Vital Signs BP 131/81 (BP Location: Right Arm)   Pulse 74   Temp 98.6 F (37 C) (Oral)   Resp 18   Ht 5\' 4"  (1.626 m)   Wt 97.1 kg (214 lb)   SpO2 100%   BMI 36.73 kg/m   Physical Exam  Constitutional: She appears well-developed and well-nourished. No distress.  Nontoxic-appearing and in no acute distress.  HENT:  Head: Normocephalic and atraumatic.  Nose: Nose normal.  Eyes: Conjunctivae and EOM are normal. Left eye exhibits no discharge. No scleral icterus.  Neck: Normal range of motion. Neck supple.  Cardiovascular: Normal rate, regular rhythm, normal heart sounds and intact distal pulses. Exam reveals no  gallop and no friction rub.  No murmur heard. Pulmonary/Chest: Effort normal and breath sounds normal. No respiratory distress.  Abdominal: Soft. Bowel sounds are normal. She exhibits no distension. There is tenderness in the right lower quadrant. There is tenderness at McBurney's point. There is no rebound and no guarding.  No CVA tenderness bilaterally.  Musculoskeletal: Normal range of motion. She exhibits no edema.  Neurological: She is alert. She exhibits normal muscle tone. Coordination normal.  Skin: Skin is warm and dry. No rash noted.  Psychiatric: She has a normal mood and affect.  Nursing note and vitals reviewed.    ED Treatments / Results  Labs (all labs ordered are listed, but only abnormal results are displayed) Labs Reviewed  URINALYSIS, ROUTINE W REFLEX MICROSCOPIC - Abnormal; Notable for the following components:      Result Value   Specific Gravity, Urine >1.030 (*)    Hgb urine dipstick TRACE (*)    All other components within normal limits  COMPREHENSIVE METABOLIC PANEL - Abnormal; Notable for the following components:   Glucose, Bld 124 (*)    BUN 21 (*)    Creatinine, Ser 1.06 (*)    GFR calc non Af Amer 57 (*)    All other components within normal limits  URINALYSIS, MICROSCOPIC (REFLEX) - Abnormal; Notable for the following components:   Bacteria, UA FEW (*)    All other components within normal limits  LIPASE, BLOOD  CBC    EKG None  Radiology Ct Abdomen Pelvis W Contrast  Result Date: 12/29/2017 CLINICAL DATA:  Abdominal pain with appendicitis suspected. Vomiting last night. Right lower quadrant tenderness today. EXAM: CT ABDOMEN AND PELVIS WITH CONTRAST TECHNIQUE: Multidetector CT imaging of the abdomen and pelvis was performed using the standard protocol following bolus administration of intravenous contrast. CONTRAST:  ISOVUE-300 IOPAMIDOL (ISOVUE-300) INJECTION 61% COMPARISON:  None. FINDINGS: Lower chest:  No contributory findings.  Hepatobiliary: No focal liver abnormality.No evidence of biliary obstruction or stone. Pancreas: Unremarkable. Spleen: Unremarkable. Adrenals/Urinary Tract: Negative adrenals. No hydronephrosis or ureteral calculus. There is an 8 mm stone in the upper pole left kidney. Left renal sinus cysts. Unremarkable bladder. Stomach/Bowel: Equivocal appendix. There is luminal distention to 7 mm but no detected wall thickening or mesial appendiceal inflammation. High-density within the lumen the is likely the same high-density material seen in the distal ileum, as opposed to appendicolith. Distal colonic diverticulosis. Vascular/Lymphatic: No acute vascular abnormality. No mass or adenopathy. Reproductive:Intramural hypoenhancing mass within the left uterus measuring 6.2 cm, consistent with fibroid. Negative ovaries. Other: No ascites or pneumoperitoneum.  Fatty umbilical hernia. Musculoskeletal: Disc degeneration and facet arthropathy with dextroscoliosis of the lumbar spine. No acute finding. IMPRESSION: 1. Equivocal for  early appendicitis. The appendix lumen is distended to 7 mm but there is no wall thickening and nomesoappendix inflammation. 2. 6 cm intramural fibroid. 3. Colonic diverticulosis. 4. 8 mm left renal calculus. 5. Fatty umbilical hernia. Electronically Signed   By: Marnee Spring M.D.   On: 12/29/2017 20:37    Procedures Procedures (including critical care time)  Medications Ordered in ED Medications  sodium chloride 0.9 % bolus 1,000 mL (0 mLs Intravenous Stopped 12/29/17 1853)  iopamidol (ISOVUE-300) 61 % injection 100 mL (100 mLs Intravenous Contrast Given 12/29/17 1957)     Initial Impression / Assessment and Plan / ED Course  I have reviewed the triage vital signs and the nursing notes.  Pertinent labs & imaging results that were available during my care of the patient were reviewed by me and considered in my medical decision making (see chart for details).     Patient presents to ED for  evaluation of 18-hour history of generalized abdominal pain that is now localized to her right lower quadrant.  She also reports several episodes of emesis.  Here she is afebrile with no recent use of antipyretics.  She does have tenderness to palpation of the right lower quadrant with no rebound or guarding present.  Lab work shows no leukocytosis or other concerning findings.  CT with equivocal early appendicitis.  Spoke to Dr. Ezzard Standing of general surgery who will take patient to OR for appendectomy.  Portions of this note were generated with Scientist, clinical (histocompatibility and immunogenetics). Dictation errors may occur despite best attempts at proofreading.   Final Clinical Impressions(s) / ED Diagnoses   Final diagnoses:  None    ED Discharge Orders    None       Dietrich Pates, PA-C 12/29/17 2108    Tegeler, Canary Brim, MD 12/29/17 [phone]

## 2017-12-29 NOTE — Progress Notes (Signed)
Cardiology Office Note    Date:  12/29/2017   ID:  Nicole Jefferson, DOB 1961/04/07, MRN [id number]  PCP:  Doristine BosworthStallings, Zoe A, MD  Cardiologist:  Dr. SwazilandJordan   Chief Complaint  Patient presents with  . Follow-up    seen for Dr. SwazilandJordan, 2 year followup for FHx of early CAD    History of Present Illness:  Nicole Jefferson is a 57 y.o. female with past medical history of obesity and elevated blood pressure.  She has strong family history of CAD.  Her father had bypass surgery in his 6350s and brother had MI in his 640s.  She has a history of borderline blood pressure issue and hyperlipidemia.  Myoview in 2012 was normal.  She was last seen by Dr. SwazilandJordan in June 2017, she was doing well at the time.  It was recommended for her to follow-up on a as needed basis.  Patient presents today for cardiology office visit.  She does nature photo as a hobby and recently went on a 2-day trip into the mountains and has not had any exertional chest pain or shortness of breath.  She wished to follow-up with cardiology once every 2 years given the her significant family history of CAD.  We did discuss briefly today regarding benefit of calcium scoring, this is something she may consider in the future to evaluate her cardiovascular risk.  On further discussion, she says she has history of occasional palpitation.  She described as a tachycardia palpitation that occurs once 1.5-2 month.  She has been having this for years and not had a previous heart monitor that did not show anything according to the patient.  She is aware that if her palpitation is getting more frequent, we can potentially consider a repeat 30-day event monitor.  We also discussed potentially monitoring herself with AliveCor or Apple watch, both of which can obtain a single lead EKG.  She says her palpitation symptoms to go away with cough.  However she does not drink significant amount of caffeine.  Previous TSH in September 2017 was normal.  Her symptom of  palpitation proceed the last lab work.   Past Medical History:  Diagnosis Date  . Borderline hyperlipidemia   . Borderline systolic HTN   . Colon polyp   . Obese   . Palpitations     Past Surgical History:  Procedure Laterality Date  . 48 hr monitor  01/04/09   no significant arrhythmia  . ENDOVENOUS ABLATION SAPHENOUS VEIN W/ LASER  04/28/2012  . TONSILLECTOMY    . US ECHOCARDIOGRAPHY  08/16/07   stress echo negative    Current Medications: Outpatient Medications Prior to Visit  Medication Sig Dispense Refill  . lisinopril-hydrochlorothiazide (PRINZIDE,ZESTORETIC) 10-12.5 MG tablet TAKE ONE TABLET BY MOUTH DAILY 30 tablet 0   No facility-administered medications prior to visit.      Allergies:   Patient has no known allergies.   Social History   Socioeconomic History  . Marital status: Married    Spouse name: Not on file  . Number of children: 4  . Years of education: Not on file  . Highest education level: Not on file  Occupational History  . Occupation: accounts payable  Social Needs  . Financial resource strain: Not on file  . Food insecurity:    Worry: Not on file    Inability: Not on file  . Transportation needs:    Medical: Not on file    Non-medical: Not on file  Tobacco Use  . Smoking status: Never Smoker  . Smokeless tobacco: Never Used  Substance and Sexual Activity  . Alcohol use: No  . Drug use: No  . Sexual activity: Yes    Birth control/protection: Pill  Lifestyle  . Physical activity:    Days per week: Not on file    Minutes per session: Not on file  . Stress: Not on file  Relationships  . Social connections:    Talks on phone: Not on file    Gets together: Not on file    Attends religious service: Not on file    Active member of club or organization: Not on file    Attends meetings of clubs or organizations: Not on file    Relationship status: Not on file  Other Topics Concern  . Not on file  Social History Narrative  . Not on  file     Family History:  The patient's family history includes Alzheimer's disease in her maternal grandmother; Cancer in her father; Heart attack in her brother; Heart disease in her father; Stroke in her maternal grandfather and paternal grandfather.   ROS:   Please see the history of present illness.    ROS All other systems reviewed and are negative.   PHYSICAL EXAM:   VS:  BP 124/86   Pulse 80   Ht 5\' 4"  (1.626 m)   Wt 214 lb 6.4 oz (97.3 kg)   BMI 36.80 kg/m    GEN: Well nourished, well developed, in no acute distress  HEENT: normal  Neck: no JVD, carotid bruits, or masses Cardiac: RRR; no murmurs, rubs, or gallops,no edema  Respiratory:  clear to auscultation bilaterally, normal work of breathing GI: soft, nontender, nondistended, + BS MS: no deformity or atrophy  Skin: warm and dry, no rash Neuro:  Alert and Oriented x 3, Strength and sensation are intact Psych: euthymic mood, full affect  Wt Readings from Last 3 Encounters:  12/29/17 214 lb 6.4 oz (97.3 kg)  01/31/17 221 lb (100.2 kg)  08/28/16 221 lb (100.2 kg)      Studies/Labs Reviewed:   EKG:  EKG is ordered today.  The ekg ordered today demonstrates normal sinus rhythm without significant ST-T wave changes  Recent Labs: 01/31/2017: BUN 18; Creatinine, Ser 1.12; Potassium 4.3; Sodium 145   Lipid Panel    Component Value Date/Time   CHOL 205 (H) 02/06/2014 2148   TRIG 183 (H) 02/06/2014 2148   HDL 45 02/06/2014 2148   CHOLHDL 4.6 02/06/2014 2148   VLDL 37 02/06/2014 2148   LDLCALC 123 (H) 02/06/2014 2148    Additional studies/ records that were reviewed today include:   Negative Myoview in May 2012   ASSESSMENT:    1. Normal cardiovascular exam   2. Essential hypertension   3. Hyperlipidemia, unspecified hyperlipidemia type   4. Palpitation      PLAN:  In order of problems listed above:  1. Hypertension: Blood pressure very well controlled on current medication.  Recent lab work  obtained by PCP, per patient showed stable renal function and electrolyte.  We will obtain lab work to confirm.  2. Family history of CAD: She herself has never diagnosed with CAD.  Myoview in 2012 was negative.  She denies any recent exertional chest discomfort or shortness of breath despite taking a 2-day trip into the mountains.  No need for ischemic workup.  We did discuss potential benefit of calcium scoring to evaluate her future cardiovascular risk, this is  not absolutely needed and may be considered in the future  3. Palpitation: According to the patient, she says she had palpitation for years and previously wore a heart monitor many years ago that did not show significant arrhythmia.  Her palpitation usually last about 15 minutes and resolved with cough.  And occurs every 1/2-2 months, there has been no increase in the frequency of the palpitation.  If her palpitation does become more frequent, we can consider heart monitor.  We also discussed potential self monitoring with Apple watch or Alivecor as well.  Potential differential diagnosis include SVT versus atrial fibrillation versus atrial flutter.    Medication Adjustments/Labs and Tests Ordered: Current medicines are reviewed at length with the patient today.  Concerns regarding medicines are outlined above.  Medication changes, Labs and Tests ordered today are listed in the Patient Instructions below. Patient Instructions  Medication Instructions:  Continue current medications  If you need a refill on your cardiac medications before your next appointment, please call your pharmacy.  Labwork: None Ordered   Testing/Procedures: None Ordered  Follow-Up: Your physician wants you to follow-up in: As Needed.      Thank you for choosing [facility] at [facility] [employer], [place]  12/29/2017 4:38 PM    [facility] [address] [place], [place], [place]  [address] Phone: [phone]; Fax:  [phone]

## 2017-12-29 NOTE — Patient Instructions (Signed)
Medication Instructions:  Continue current medications  If you need a refill on your cardiac medications before your next appointment, please call your pharmacy.  Labwork: None Ordered  Testing/Procedures: None Ordered  Follow-Up: Your physician wants you to follow-up in: As Needed.      Thank you for choosing [facility] at [facility]!!       

## 2017-12-29 NOTE — Transfer of Care (Signed)
Immediate Anesthesia Transfer of Care Note  Patient: Nicole Jefferson  Procedure(s) Performed: APPENDECTOMY LAPAROSCOPIC (N/A Abdomen)  Patient Location: PACU  Anesthesia Type:General  Level of Consciousness: awake, alert , oriented and patient cooperative  Airway & Oxygen Therapy: Patient Spontanous Breathing and Patient connected to face mask  Post-op Assessment: Report given to RN and Post -op Vital signs reviewed and stable  Post vital signs: Reviewed and stable  Last Vitals:  Vitals Value Taken Time  BP    Temp    Pulse 78 12/29/2017 11:41 PM  Resp 7 12/29/2017 11:41 PM  SpO2 100 % 12/29/2017 11:41 PM  Vitals shown include unvalidated device data.  Last Pain:  Vitals:   12/29/17 1845  TempSrc:   PainSc: 0-No pain         Complications: No apparent anesthesia complications

## 2017-12-29 NOTE — H&P (Addendum)
Re:   Nicole Jefferson DOB:   [birth year] MRN:   [id number]  Chief Complaint Abdominal pain  ASSESEMENT AND PLAN: 1.  Early appendicitis  I discussed with the patient the indications and risks of appendiceal surgery.  The primary risks of appendiceal surgery include, but are not limited to, bleeding, infection, bowel surgery, and open surgery.  There is also the risk that the patient may have continued symptoms after surgery.  We discussed the typical post-operative recovery course. I tried to answer the patient's questions.  2.  HTN 3.  Morbid obesity  Weight - 221, BMI - 37.9 4.  Uterine fibroid 5.  Left renal calculus 6.  Fatty umbilical hernia  Chief Complaint  Patient presents with  . Abdominal Pain   PHYSICIAN REQUESTING CONSULTATION:  Dietrich Pates, PA, [facility]  HISTORY OF PRESENT ILLNESS: Nicole Jefferson is a 57 y.o. (DOB: 06-12-1961)  white female whose primary care physician is Doristine Bosworth, MD.  Her husband is at the bedside.  He had his appendix out by Dr. Cliffton Asters about 4 months ago!   The patient was in otherwise good health, when she awoke around midnight 22nd of April with diffuse abdominal discomfort. She felt that she could either burp or pass flatus, she would feel better. She took some Pepto-Bismol which helped a little bit and she vomited one time. She then woke up this morning with pain located to the right lower quadrant and went to [facility]  She has no history of peptic ulcer disease, liver disease, colon disease, and previous abdominal surgery. She's had a colonoscopy by Dr. Loreta Ave most recently about 2 years ago.  She has a history of colonic polyps, but her next colonoscopy is to be 5 years from her last colonoscopy.  CT scan of the abdomen - 12/29/2017 - 1. Equivocal for early appendicitis. The appendix lumen is distended to 7 mm but there is no wall thickening and nomesoappendix inflammation.  2. 6 cm intramural fibroid.   Gyn - Dr.  Estanislado Pandy  3. Colonic diverticulosis.  4. 8 mm left renal calculus.  5. Fatty umbilical hernia. WBC - 7,300 - 12/29/2017   Past Medical History:  Diagnosis Date  . Borderline hyperlipidemia   . Borderline systolic HTN   . Colon polyp   . Obese   . Palpitations       Past Surgical History:  Procedure Laterality Date  . 48 hr monitor  01/04/09   no significant arrhythmia  . ENDOVENOUS ABLATION SAPHENOUS VEIN W/ LASER  04/28/2012  . TONSILLECTOMY    . US ECHOCARDIOGRAPHY  08/16/07   stress echo negative      No current facility-administered medications for this encounter.    Current Outpatient Medications  Medication Sig Dispense Refill  . lisinopril-hydrochlorothiazide (PRINZIDE,ZESTORETIC) 10-12.5 MG tablet Take 1 tablet by mouth daily. 90 tablet 3     No Known Allergies  REVIEW OF SYSTEMS: Skin:  No history of rash.  No history of abnormal moles. Infection:  No history of hepatitis or HIV.  No history of MRSA. Neurologic:  No history of stroke.  No history of seizure.  No history of headaches. Cardiac:  HTN x 1 year.  Strong family history. No history of heart disease.   Pulmonary:  Does not smoke cigarettes.  No asthma or bronchitis.  No OSA/CPAP.  Endocrine:  No diabetes. Questionable mild thytoid disease. Gastrointestinal:   See HPI Urologic:  Left kidney stone on  CT scan Musculoskeletal:  No history of joint or back disease. Hematologic:  No bleeding disorder.  No history of anemia.  Not anticoagulated. Psycho-social:  The patient is oriented.   The patient has no obvious psychologic or social impairment to understanding our conversation and plan.  SOCIAL and FAMILY HISTORY: Married.  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 She has 3 children.  Family history of CAD.    PHYSICAL EXAM: BP 131/81 (BP Location: Right Arm)   Pulse 74   Temp 98.6 F (37 C) (Oral)   Resp 18   Ht 5\' 4"  (1.626 m)   Wt 97.1 kg (214 lb)   SpO2 100%   BMI  36.73 kg/m   General: Obese WF who is alert and generally healthy appearing.  Skin:  Inspection and palpation - no mass or rash. Eyes:  Conjunctiva and lids unremarkable.            Pupils are equal Ears, Nose, Mouth, and Throat:  Ears and nose unremarkable            Lips and teeth are unremarable. Neck: Supple. No mass, trachea midline.  No thyroid mass. Lymph Nodes:  No supraclavicular, cervical, or inguinal nodes. Lungs: Normal respiratory effort.  Clear to auscultation and symmetric breath sounds. Heart:  Palpation of the heart is normal.            Auscultation: RRR. No murmur or rub.  Abdomen: Soft. No mass.  Small umbilical hernia.  Mild RLQ soreness.  Normal bowel sounds.  No abdominal scars. Rectal: Not done. Musculoskeletal:  Normal gait.            Good muscle strength and ROM  in upper and lower extremities.  Neurologic:  Grossly intact to motor and sensory function. Psychiatric: Normal judgement and insight. Behavior is normal.            Oriented to time, person, place.   DATA REVIEWED, COUNSELING AND COORDINATION OF CARE: Epic notes reviewed. Counseling and coordination of care exceeded more than 50% of the time spent with patient. Total time spent with patient and charting: 40 minutes.  Ovidio Kinavid Vishaal Strollo, MD,  [facility] [address]  [address]   [place], [place]    [id number] Phone:  [phone] FAX:  [phone]

## 2017-12-29 NOTE — Anesthesia Procedure Notes (Signed)
Procedure Name: Intubation Date/Time: 12/29/2017 10:47 PM Performed by: Vanessa Durhamochran, Lataysha Vohra Glenn, CRNA Pre-anesthesia Checklist: Patient identified, Emergency Drugs available, Suction available and Patient being monitored Patient Re-evaluated:Patient Re-evaluated prior to induction Oxygen Delivery Method: Circle system utilized Preoxygenation: Pre-oxygenation with 100% oxygen Induction Type: IV induction and Rapid sequence Laryngoscope Size: 2 and Miller Grade View: Grade I Tube type: Oral Tube size: 7.0 mm Number of attempts: 1 Airway Equipment and Method: Stylet Placement Confirmation: ETT inserted through vocal cords under direct vision,  positive ETCO2 and breath sounds checked- equal and bilateral Secured at: 22 cm Tube secured with: Tape Dental Injury: Teeth and Oropharynx as per pre-operative assessment

## 2017-12-29 NOTE — Op Note (Signed)
Re:   Nicole Jefferson Raisanen DOB:   October 29, 1960 MRN:   [id number]                   [facility]  DATE OF PROCEDURE: 12/29/2017                              OPERATIVE REPORT  PREOPERATIVE DIAGNOSIS:  Appendicitis  POSTOPERATIVE DIAGNOSIS:  Early turgid appendicitis.  PROCEDURE:  Laparoscopic appendectomy.  SURGEON:  Sandria Balesavid H. Ezzard StandingNewman, MD  ASSISTANT:  No first assistant.  ANESTHESIA:  General endotracheal.  Anesthesiologist: Leonides GrillsEllender, Ryan P, MD CRNA: Vanessa Durhamochran, Ronald Glenn, CRNA  ASA:  2E  ESTIMATED BLOOD LOSS:  Minimal.  DRAINS: none   SPECIMEN:   Appendix  COUNTS CORRECT:  YES  INDICATIONS FOR PROCEDURE: Nicole Jefferson Wesolowski is a 57 y.o. (DOB: October 29, 1960) white female whose primary care doctor is Doristine BosworthStallings, Zoe A, MD and comes to the OR for an appendectomy.   I discussed with the patient, the indications and potential complications of appendiceal surgery.  The potential complications include, but are not limited to, bleeding, open surgery, bowel resection, and the possibility of another diagnosis.  OPERATIVE NOTE:  The patient underwent a general endotracheal anesthetic as supervised by Anesthesiologist: Leonides GrillsEllender, Ryan P, MD CRNA: Vanessa Durhamochran, Ronald Glenn, CRNA, General, [facility].  The patient was given 2 g of cefotetan at the beginning of the procedure and the abdomen was prepped with ChloraPrep.   A time-out was held and surgical checklist run.  An infraumbilical incision was made with sharp dissection carried down to the abdominal cavity.  An 12 mm Hasson trocar was inserted through the infraumbilical incision and into the peritoneal cavity.  A 30 degree 5 mm laparoscope was inserted through a 12 mm Hasson trocar and the Hasson trocar secured with a 0 Vicryl suture.  I placed a 5 mm trocar in the right upper quadrant and a 5 mm torcar in left lower quadrant and did abdominal exploration.    The right and left lobes of liver unremarkable.  Stomach was  unremarkable.  The pelvic organs were unremarkable.  She did have a uterine fibroid.  I saw no other intra-abdominal abnormality.  The patient had early turgid appendicitis with the appendix located right lower quadrant behind the cecum.  The appendix was not ruptured.  The mesentery of the appendix was divided with a Harmonic scalpel.  I got to the base of the appendix.  I then used a blue load 45 mm Ethicon Endo-GIA stapler and fired this across the base of the appendix.  I placed the appendix in EndoCatch bag and delivered the bag through the umbilical incision.  I irrigated the abdomen with 500 cc of saline.  After irrigating the abdomen, I then removed the trocars, in turn.  The umbilical port fascia was closed with 0 Vicryl suture.   I closed the skin each site with a 4-0 Monocryl suture and painted the wounds with DermaBond.  I then injected a total of 30 mL of 0.25% Marcaine at the incisions.  Sponge and needle count were correct at the end of the case.  The patient was transferred to the recovery room in good condition.  The patient tolerated the procedure well and it depends on the patient's post op clinical course as to when the patient could be discharged.   Ovidio Kinavid Kimoni Pagliarulo, MD, [facility] Pager:  [phone] Office phone:  [phone]

## 2017-12-29 NOTE — ED Triage Notes (Signed)
Abdominal pain since last night. Feels like gas. Bloating. She vomiting last night. Right lower quadrant tenderness today.

## 2017-12-30 ENCOUNTER — Encounter (HOSPITAL_COMMUNITY): Payer: Self-pay | Admitting: Surgery

## 2017-12-30 MED ORDER — MORPHINE SULFATE (PF) 2 MG/ML IV SOLN
1.0000 mg | INTRAVENOUS | Status: DC | PRN
  Administered 2017-12-30 (×2): 2 mg via INTRAVENOUS
  Filled 2017-12-30 (×2): qty 1

## 2017-12-30 MED ORDER — ACETAMINOPHEN 325 MG PO TABS
650.0000 mg | ORAL_TABLET | Freq: Three times a day (TID) | ORAL | Status: DC
  Administered 2017-12-30: 650 mg via ORAL
  Filled 2017-12-30: qty 2

## 2017-12-30 MED ORDER — MORPHINE SULFATE (PF) 2 MG/ML IV SOLN: 1.0000 mg | INTRAVENOUS | Status: DC | PRN

## 2017-12-30 MED ORDER — LISINOPRIL-HYDROCHLOROTHIAZIDE 10-12.5 MG PO TABS: 1.0000 | ORAL_TABLET | Freq: Every day | ORAL | Status: DC

## 2017-12-30 MED ORDER — TRAMADOL HCL 50 MG PO TABS: 50.0000 mg | ORAL_TABLET | Freq: Four times a day (QID) | ORAL | Status: DC | PRN

## 2017-12-30 MED ORDER — KCL IN DEXTROSE-NACL 20-5-0.45 MEQ/L-%-% IV SOLN
INTRAVENOUS | Status: DC
  Administered 2017-12-30: 01:00:00 via INTRAVENOUS
  Filled 2017-12-30: qty 1000

## 2017-12-30 MED ORDER — ONDANSETRON HCL 4 MG/2ML IJ SOLN
4.0000 mg | Freq: Four times a day (QID) | INTRAMUSCULAR | Status: DC | PRN
  Administered 2017-12-30: 4 mg via INTRAVENOUS
  Filled 2017-12-30 (×2): qty 2

## 2017-12-30 MED ORDER — HYDROCHLOROTHIAZIDE 12.5 MG PO CAPS
12.5000 mg | ORAL_CAPSULE | Freq: Every day | ORAL | Status: DC
  Filled 2017-12-30: qty 1

## 2017-12-30 MED ORDER — HYDROCODONE-ACETAMINOPHEN 5-325 MG PO TABS: 1.0000 | ORAL_TABLET | Freq: Four times a day (QID) | ORAL | 0 refills | Status: DC | PRN

## 2017-12-30 MED ORDER — HEPARIN SODIUM (PORCINE) 5000 UNIT/ML IJ SOLN
5000.0000 [IU] | Freq: Three times a day (TID) | INTRAMUSCULAR | Status: DC
  Administered 2017-12-30: 5000 [IU] via SUBCUTANEOUS
  Filled 2017-12-30: qty 1

## 2017-12-30 MED ORDER — OXYCODONE HCL 5 MG PO TABS: 5.0000 mg | ORAL_TABLET | ORAL | Status: DC | PRN

## 2017-12-30 MED ORDER — ONDANSETRON 4 MG PO TBDP: 4.0000 mg | ORAL_TABLET | Freq: Four times a day (QID) | ORAL | Status: DC | PRN

## 2017-12-30 MED ORDER — HYDROCODONE-ACETAMINOPHEN 5-325 MG PO TABS: 1.0000 | ORAL_TABLET | ORAL | Status: DC | PRN

## 2017-12-30 MED ORDER — LISINOPRIL 10 MG PO TABS
10.0000 mg | ORAL_TABLET | Freq: Every day | ORAL | Status: DC
  Filled 2017-12-30: qty 1

## 2017-12-30 NOTE — Discharge Instructions (Signed)

## 2017-12-30 NOTE — Progress Notes (Signed)
1 Day Post-Op    CC:  Abdominal pain  Subjective: She is still pretty sore and had some nausea after getting up.  Sites look fine and she did eat some.    Objective: Vital signs in last 24 hours: Temp:  [97.6 F (36.4 C)-98.8 F (37.1 C)] 97.6 F (36.4 C) (04/24 0342) Pulse Rate:  [51-90] 61 (04/24 0555) Resp:  [7-20] 16 (04/24 0151) BP: (103-131)/(62-86) 119/78 (04/24 0555) SpO2:  [95 %-100 %] 95 % (04/24 0555) Weight:  [97.1 kg (214 lb)-97.3 kg (214 lb 6.4 oz)] 97.1 kg (214 lb) (04/23 1724)  Nothing useful in I/O Afebrile, VSS No labs Intake/Output from previous day: 04/23 0701 - 04/24 0700 In: 495 [I.V.:395; IV Piggyback:100] Out: 6 [Urine:1; Blood:5] Intake/Output this shift: No intake/output data recorded.  General appearance: alert, cooperative and no distress Resp: clear to auscultation bilaterally GI: soft, sore sites all look good.    Lab Results:  Recent Labs    12/29/17 1748  WBC 7.3  HGB 14.5  HCT 41.6  PLT 269    BMET Recent Labs    12/29/17 1748  NA 140  K 3.8  CL 102  CO2 27  GLUCOSE 124*  BUN 21*  CREATININE 1.06*  CALCIUM 9.4   PT/INR No results for input(s): LABPROT, INR in the last 72 hours.  Recent Labs  Lab 12/29/17 1748  AST 27  ALT 21  ALKPHOS 52  BILITOT 0.3  PROT 7.0  ALBUMIN 4.1     Lipase     Component Value Date/Time   LIPASE 47 12/29/2017 1748     Prior to Admission medications   Medication Sig Start Date End Date Taking? Authorizing Provider  cholecalciferol (VITAMIN D) 1000 units tablet Take 5,000 Units by mouth daily.   Yes [provider]  lisinopril-hydrochlorothiazide (PRINZIDE,ZESTORETIC) 10-12.5 MG tablet Take 1 tablet by mouth daily. 12/29/17  Yes Azalee CourseMeng, Hao, PA    Medications: . acetaminophen  650 mg Oral Q8H  . cefoTEtan in Dextrose 5%      . heparin injection (subcutaneous)  5,000 Units Subcutaneous Q8H  . lisinopril  10 mg Oral Daily   And  . hydrochlorothiazide  12.5 mg Oral Daily   . HYDROmorphone       . dextrose 5 % and 0.45 % NaCl with KCl 20 mEq/L 75 mL/hr at 12/30/17 0100   Anti-infectives (From admission, onward)   Start     Dose/Rate Route Frequency Ordered Stop   12/29/17 2221  cefoTEtan in Dextrose 5% (CEFOTAN) 2-2.08 GM-%(50ML) IVPB    Note to Pharmacy:  Nadene Rubinsochran, Ron   : cabinet override      12/29/17 2221 12/30/17 1029      Assessment/Plan Hypertension BMI 37.9 Fatty umbilical hernia Left renal calculus Uterine fibriod   Early turgid appendicitis Laparoscopic appendectomy, 12/30/17, Dr. Ovidio Kinavid Newman  FEN: IV fluids/regular diet ID:  Cefotetan ordered DVT: heparin/SCD Follow up:     Plan:  Will check on her later and aim for discharge later today.        LOS: 0 days    Murat Rideout 12/30/2017 [phone]

## 2017-12-30 NOTE — Discharge Summary (Signed)
[facility] Discharge Summary   Patient ID: Nicole Jefferson MRN: [id number] DOB/AGE: 05/02/1961 57 y.o.  Admit date: 12/29/2017 Discharge date: 12/30/2017  Admitting Diagnosis: Acute appendicitis  Discharge Diagnosis Patient Active Problem List   Diagnosis Date Noted  . Appendicitis 12/29/2017  . Family history of colon cancer 10/05/2012  . Venous disease 05/19/2012  . Varicose veins of left lower extremity with inflammation 05/19/2012  . Ganglion cyst of wrist 10/13/2011  . Hyperlipidemia 01/10/2011    Consultants None Imaging: Ct Abdomen Pelvis W Contrast  Result Date: 12/29/2017 CLINICAL DATA:  Abdominal pain with appendicitis suspected. Vomiting last night. Right lower quadrant tenderness today. EXAM: CT ABDOMEN AND PELVIS WITH CONTRAST TECHNIQUE: Multidetector CT imaging of the abdomen and pelvis was performed using the standard protocol following bolus administration of intravenous contrast. CONTRAST:  100mL ISOVUE-300 IOPAMIDOL (ISOVUE-300) INJECTION 61% COMPARISON:  None. FINDINGS: Lower chest:  No contributory findings. Hepatobiliary: No focal liver abnormality.No evidence of biliary obstruction or stone. Pancreas: Unremarkable. Spleen: Unremarkable. Adrenals/Urinary Tract: Negative adrenals. No hydronephrosis or ureteral calculus. There is an 8 mm stone in the upper pole left kidney. Left renal sinus cysts. Unremarkable bladder. Stomach/Bowel: Equivocal appendix. There is luminal distention to 7 mm but no detected wall thickening or mesial appendiceal inflammation. High-density within the lumen the is likely the same high-density material seen in the distal ileum, as opposed to appendicolith. Distal colonic diverticulosis. Vascular/Lymphatic: No acute vascular abnormality. No mass or adenopathy. Reproductive:Intramural hypoenhancing mass within the left uterus measuring 6.2 cm, consistent with fibroid. Negative ovaries. Other: No ascites or pneumoperitoneum.  Fatty  umbilical hernia. Musculoskeletal: Disc degeneration and facet arthropathy with dextroscoliosis of the lumbar spine. No acute finding. IMPRESSION: 1. Equivocal for early appendicitis. The appendix lumen is distended to 7 mm but there is no wall thickening and nomesoappendix inflammation. 2. 6 cm intramural fibroid. 3. Colonic diverticulosis. 4. 8 mm left renal calculus. 5. Fatty umbilical hernia. Electronically Signed   By: Marnee SpringJonathon  Watts M.D.   On: 12/29/2017 20:37    Procedures Dr. Ezzard StandingNewman (12/29/17) - Laparoscopic Appendectomy  Hospital Course:  Nicole BastVicki R Jefferson is a 56yo [facility] 1 day of worsening abdominal pain that localized to her RLQ.  Workup included CT scan which showed early appendicitis.  Patient was admitted and underwent procedure listed above.  Tolerated procedure well and was transferred to the floor.  Diet was advanced as tolerated.  On POD1, the patient was voiding well, tolerating diet, ambulating well, pain well controlled, vital signs stable, incisions c/d/i and felt stable for discharge home.  Patient will follow up as below and knows to call with questions or concerns.    I have personally reviewed the patients medication history on the [place] controlled substance database.     Allergies as of 12/30/2017   No Known Allergies     Medication List    TAKE these medications   cholecalciferol 1000 units tablet Commonly known as:  VITAMIN D Take 5,000 Units by mouth daily.   HYDROcodone-acetaminophen 5-325 MG tablet Commonly known as:  NORCO Take 1 tablet by mouth every 6 (six) hours as needed for severe pain.   lisinopril-hydrochlorothiazide 10-12.5 MG tablet Commonly known as:  PRINZIDE,ZESTORETIC Take 1 tablet by mouth daily.        Follow-up Information    [facility] [place] Call.   Specialty:  General Surgery Why:  We are working on your appointment, please call to confirm. Please arrive 30 minutes  prior to your  appointment to check in and fill out paperwork. Bring photo ID and insurance information. Contact information: [address] [place] [address] [phone]          Signed: Franne Forts, [facility] 12/30/2017, 1:42 PM Pager: [phone] Consults: [phone] Mon-Fri 7:00 am-4:30 pm Sat-Sun 7:00 am-11:30 am

## 2017-12-30 NOTE — Progress Notes (Signed)
Pt alert, oriented, and tolerating diet.  D/C instructions and prescription was given.  All questions answered. Pt d/cd home with spouse.

## 2017-12-31 NOTE — Anesthesia Postprocedure Evaluation (Signed)
Anesthesia Post Note  Patient: Nicole Jefferson  Procedure(s) Performed: APPENDECTOMY LAPAROSCOPIC (N/A Abdomen)     Patient location during evaluation: PACU Anesthesia Type: General Level of consciousness: awake and alert Pain management: pain level controlled Vital Signs Assessment: post-procedure vital signs reviewed and stable Respiratory status: spontaneous breathing, nonlabored ventilation, respiratory function stable and patient connected to nasal cannula oxygen Cardiovascular status: blood pressure returned to baseline and stable Postop Assessment: no apparent nausea or vomiting Anesthetic complications: no    Last Vitals:  Vitals:   12/30/17 1000 12/30/17 1305  BP: 113/62 109/64  Pulse: 68 62  Resp:  17  Temp:    SpO2:  95%    Last Pain:  Vitals:   12/30/17 0644  TempSrc:   PainSc: 5                  Ryan P Ellender

## 2018-01-05 ENCOUNTER — Other Ambulatory Visit: Payer: Self-pay

## 2018-01-05 ENCOUNTER — Emergency Department (HOSPITAL_BASED_OUTPATIENT_CLINIC_OR_DEPARTMENT_OTHER)
Admission: EM | Admit: 2018-01-05 | Discharge: 2018-01-05 | Disposition: A | Discharge status: Home / Self Care | Payer: 59 | Attending: Emergency Medicine | Admitting: Emergency Medicine

## 2018-01-05 ENCOUNTER — Encounter (HOSPITAL_BASED_OUTPATIENT_CLINIC_OR_DEPARTMENT_OTHER): Payer: Self-pay

## 2018-01-05 ENCOUNTER — Emergency Department (HOSPITAL_BASED_OUTPATIENT_CLINIC_OR_DEPARTMENT_OTHER): Payer: 59

## 2018-01-05 DIAGNOSIS — Z79899 Other long term (current) drug therapy: Secondary | ICD-10-CM | POA: Diagnosis not present

## 2018-01-05 DIAGNOSIS — T7840XA Allergy, unspecified, initial encounter: Secondary | ICD-10-CM | POA: Insufficient documentation

## 2018-01-05 DIAGNOSIS — R062 Wheezing: Secondary | ICD-10-CM | POA: Diagnosis present

## 2018-01-05 DIAGNOSIS — Z9109 Other allergy status, other than to drugs and biological substances: Secondary | ICD-10-CM

## 2018-01-05 MED ORDER — LORATADINE 10 MG PO TABS
10.0000 mg | ORAL_TABLET | Freq: Once | ORAL | Status: AC
  Administered 2018-01-05: 10 mg via ORAL
  Filled 2018-01-05: qty 1

## 2018-01-05 MED ORDER — FLUTICASONE PROPIONATE 50 MCG/ACT NA SUSP: 2.0000 | Freq: Every day | NASAL | 0 refills | Status: DC

## 2018-01-05 MED ORDER — PREDNISONE 50 MG PO TABS
60.0000 mg | ORAL_TABLET | Freq: Once | ORAL | Status: AC
  Administered 2018-01-05: 60 mg via ORAL
  Filled 2018-01-05: qty 1

## 2018-01-05 MED ORDER — PREDNISONE 20 MG PO TABS: ORAL_TABLET | ORAL | 0 refills | Status: DC

## 2018-01-05 NOTE — ED Notes (Signed)
Pt verbalizes understanding of d/c instructions and denies any further needs at this time. 

## 2018-01-05 NOTE — ED Triage Notes (Signed)
C/o wheezing x 2 days-had and chest congestion x 3 days-NAD-steady gait

## 2018-01-06 ENCOUNTER — Encounter (HOSPITAL_BASED_OUTPATIENT_CLINIC_OR_DEPARTMENT_OTHER): Payer: Self-pay | Admitting: Emergency Medicine

## 2018-01-06 NOTE — ED Provider Notes (Signed)
[facility] EMERGENCY DEPARTMENT Provider Note   CSN: [id number] Arrival date & time: 01/05/18  2019     History   Chief Complaint Chief Complaint  Patient presents with  . Wheezing    HPI Nicole Jefferson is a 57 y.o. female.  The history is provided by the patient.  Wheezing   This is a new problem. The current episode started 2 days ago. The problem occurs constantly. The problem has not changed since onset.Pertinent negatives include no chest pain, no fever, no abdominal pain, no vomiting, no diarrhea, no dysuria, no ear pain, no headaches, no rhinorrhea, no sore throat, no swollen glands, no neck pain, no cough, no hemoptysis, no sputum production and no rash. The problem's precipitants include pollens. The treatment provided no relief. She has had prior ED visits. She has had no prior ICU admissions. Her past medical history does not include asthma.    Past Medical History:  Diagnosis Date  . Borderline hyperlipidemia   . Borderline systolic HTN   . Colon polyp   . Obese   . Palpitations     Patient Active Problem List   Diagnosis Date Noted  . Appendicitis 12/29/2017  . Family history of colon cancer 10/05/2012  . Venous disease 05/19/2012  . Varicose veins of left lower extremity with inflammation 05/19/2012  . Ganglion cyst of wrist 10/13/2011  . Hyperlipidemia 01/10/2011    Past Surgical History:  Procedure Laterality Date  . 48 hr monitor  01/04/09   no significant arrhythmia  . ENDOVENOUS ABLATION SAPHENOUS VEIN W/ LASER  04/28/2012  . LAPAROSCOPIC APPENDECTOMY N/A 12/29/2017   Procedure: APPENDECTOMY LAPAROSCOPIC;  Surgeon: Ovidio Kin, MD;  Location: [facility] ORS;  Service: General;  Laterality: N/A;  . TONSILLECTOMY    . US ECHOCARDIOGRAPHY  08/16/07   stress echo negative     OB History    Gravida  3   Para  3   Term      Preterm      AB      Living  3     SAB      TAB      Ectopic      Multiple      Live Births                 Home Medications    Prior to Admission medications   Medication Sig Start Date End Date Taking? Authorizing Provider  cholecalciferol (VITAMIN D) 1000 units tablet Take 5,000 Units by mouth daily.    [provider]  fluticasone (FLONASE) 50 MCG/ACT nasal spray Place 2 sprays into both nostrils daily. 01/05/18   Dusti Tetro, MD  HYDROcodone-acetaminophen (NORCO) 5-325 MG tablet Take 1 tablet by mouth every 6 (six) hours as needed for severe pain. 12/30/17   Meuth, Lina Sar, PA-C  lisinopril-hydrochlorothiazide (PRINZIDE,ZESTORETIC) 10-12.5 MG tablet Take 1 tablet by mouth daily. 12/29/17   Azalee Course, PA  predniSONE (DELTASONE) 20 MG tablet 3 tabs po day one, then 2 po daily x 4 days 01/05/18   Nicanor Alcon, Zaya Kessenich, MD    Family History Family History  Problem Relation Age of Onset  . Heart disease Father   . Cancer Father   . Heart attack Brother        5/12  . Alzheimer's disease Maternal Grandmother   . Stroke Maternal Grandfather   . Stroke Paternal Grandfather     Social History Social History   Tobacco Use  . Smoking status: Never  Smoker  . Smokeless tobacco: Never Used  Substance Use Topics  . Alcohol use: No  . Drug use: No     Allergies   Patient has no known allergies.   Review of Systems Review of Systems  Constitutional: Negative for diaphoresis and fever.  HENT: Negative for ear pain, rhinorrhea and sore throat.   Respiratory: Positive for wheezing. Negative for cough, hemoptysis, sputum production and shortness of breath.   Cardiovascular: Negative for chest pain, palpitations and leg swelling.  Gastrointestinal: Negative for abdominal pain, diarrhea and vomiting.  Genitourinary: Negative for dysuria.  Musculoskeletal: Negative for neck pain.  Skin: Negative for rash.  Neurological: Negative for headaches.  All other systems reviewed and are negative.    Physical Exam Updated Vital Signs BP (!) 146/88 (BP Location: Left Arm)    Pulse 79   Temp 98.9 F (37.2 C) (Oral)   Resp 18   Ht  (1.626 m)   Wt 98.8 kg (217 lb 13 oz)   SpO2 98%   BMI 37.39 kg/m   Physical Exam  Constitutional: She is oriented to person, place, and time. She appears well-developed and well-nourished. No distress.  HENT:  Head: Normocephalic and atraumatic.  Mouth/Throat: No oropharyngeal exudate.  Eyes: Pupils are equal, round, and reactive to light. Conjunctivae are normal.  Neck: Normal range of motion. Neck supple. No JVD present.  Cardiovascular: Normal rate, regular rhythm, normal heart sounds and intact distal pulses.  Pulmonary/Chest: Effort normal and breath sounds normal. No stridor. No respiratory distress. She has no wheezes. She has no rales.  Abdominal: Soft. Bowel sounds are normal. She exhibits no mass. There is no tenderness. There is no rebound and no guarding.  Musculoskeletal: Normal range of motion.  Neurological: She is alert and oriented to person, place, and time. She displays normal reflexes.  Skin: Skin is warm and dry. Capillary refill takes less than 2 seconds.  Psychiatric: She has a normal mood and affect.     ED Treatments / Results   Radiology Dg Chest 2 View  Result Date: 01/05/2018 CLINICAL DATA:  Wheezing x2 days EXAM: CHEST - 2 VIEW COMPARISON:  None. FINDINGS: Lungs are clear.  No pleural effusion or pneumothorax. The heart is normal in size. Visualized osseous structures are within normal limits. IMPRESSION: Normal chest radiographs. Electronically Signed   By: Charline Bills M.D.   On: 01/05/2018 21:19    Procedures Procedures (including critical care time)  Medications Ordered in ED Medications  predniSONE (DELTASONE) tablet 60 mg (60 mg Oral Given 01/05/18 2340)  loratadine (CLARITIN) tablet 10 mg (10 mg Oral Given 01/05/18 2340)       Final Clinical Impressions(s) / ED Diagnoses   Final diagnoses:  Environmental allergies   Not actively wheezing, no indication for nebs at  this time.  Will treat for environmental allergies.    Return for weakness, numbness, changes in vision or speech, fevers >100.4 unrelieved by medication, shortness of breath, intractable vomiting, or diarrhea, abdominal pain, Inability to tolerate liquids or food, cough, altered mental status or any concerns. No signs of systemic illness or infection. The patient is nontoxic-appearing on exam and vital signs are within normal limits.   I have reviewed the triage vital signs and the nursing notes. Pertinent labs &imaging results that were available during my care of the patient were reviewed by me and considered in my medical decision making (see chart for details).  After history, exam, and medical workup I feel the patient  has been appropriately medically screened and is safe for discharge home. Pertinent diagnoses were discussed with the patient. Patient was given return precautions.  ED Discharge Orders        Ordered    fluticasone (FLONASE) 50 MCG/ACT nasal spray  Daily     01/05/18 2334    predniSONE (DELTASONE) 20 MG tablet     01/05/18 2334       Jaylianna Tatlock, MD 01/06/18 1610

## 2018-01-25 ENCOUNTER — Encounter: Payer: Self-pay | Admitting: Family Medicine

## 2018-01-25 ENCOUNTER — Ambulatory Visit (INDEPENDENT_AMBULATORY_CARE_PROVIDER_SITE_OTHER): Payer: 59 | Admitting: Family Medicine

## 2018-01-25 ENCOUNTER — Other Ambulatory Visit: Payer: Self-pay

## 2018-01-25 VITALS — BP 112/79 | HR 88 | Temp 99.3°F | Resp 16 | Ht 64.0 in | Wt 212.8 lb

## 2018-01-25 DIAGNOSIS — S8011XA Contusion of right lower leg, initial encounter: Secondary | ICD-10-CM

## 2018-01-25 DIAGNOSIS — M25471 Effusion, right ankle: Secondary | ICD-10-CM | POA: Diagnosis not present

## 2018-01-25 MED ORDER — IBUPROFEN 800 MG PO TABS: 800.0000 mg | ORAL_TABLET | Freq: Two times a day (BID) | ORAL | 0 refills | Status: AC

## 2018-01-25 NOTE — Progress Notes (Signed)
Chief Complaint  Patient presents with  . lower leg laceration weekend before last    right lower leg injury, heavy plastic snow shovel fell down on leg, swelling,redness and pain with walking, twisting or pulling of leg.  ? torn ligament, swelling goes from lower ankle down  into foot    HPI  This is a patient who is here after 2 weeks of injuring her right lower leg and ankle Pt reports that a heavy snow shovel fell off the wall and hit her on the lower leg above the ankle She states that she used neosporin and betadine on the laceration which has healed She has tenderness of the right lower leg No drainage The foot moves with normal range of motion Her ankle was swelling but she walks normally without much pain She states that stretching the leg causes soreness.  Lab Results  Component Value Date   CREATININE 1.06 (H) 12/29/2017    4 review of systems  Past Medical History:  Diagnosis Date  . Borderline hyperlipidemia   . Borderline systolic HTN   . Colon polyp   . Obese   . Palpitations     Current Outpatient Medications  Medication Sig Dispense Refill  . cholecalciferol (VITAMIN D) 1000 units tablet Take 5,000 Units by mouth daily.    Marland Kitchen lisinopril-hydrochlorothiazide (PRINZIDE,ZESTORETIC) 10-12.5 MG tablet Take 1 tablet by mouth daily. 90 tablet 3  . fluticasone (FLONASE) 50 MCG/ACT nasal spray Place 2 sprays into both nostrils daily. (Patient not taking: Reported on 01/25/2018) 16 g 0  . HYDROcodone-acetaminophen (NORCO) 5-325 MG tablet Take 1 tablet by mouth every 6 (six) hours as needed for severe pain. (Patient not taking: Reported on 01/25/2018) 20 tablet 0  . ibuprofen (ADVIL,MOTRIN) 800 MG tablet Take 1 tablet (800 mg total) by mouth 2 (two) times daily for 7 days. 30 tablet 0  . predniSONE (DELTASONE) 20 MG tablet 3 tabs po day one, then 2 po daily x 4 days (Patient not taking: Reported on 01/25/2018) 11 tablet 0   No current facility-administered medications for  this visit.     Allergies: No Known Allergies  Past Surgical History:  Procedure Laterality Date  . 48 hr monitor  01/04/09   no significant arrhythmia  . ENDOVENOUS ABLATION SAPHENOUS VEIN W/ LASER  04/28/2012  . LAPAROSCOPIC APPENDECTOMY N/A 12/29/2017   Procedure: APPENDECTOMY LAPAROSCOPIC;  Surgeon: Ovidio Kin, MD;  Location: [facility];  Service: General;  Laterality: N/A;  . TONSILLECTOMY    . US ECHOCARDIOGRAPHY  08/16/07   stress echo negative    Social History   Socioeconomic History  . Marital status: Married    Spouse name: Not on file  . Number of children: 4  . Years of education: Not on file  . Highest education level: Not on file  Occupational History  . Occupation: accounts payable  Social Needs  . Financial resource strain: Not on file  . Food insecurity:    Worry: Not on file    Inability: Not on file  . Transportation needs:    Medical: Not on file    Non-medical: Not on file  Tobacco Use  . Smoking status: Never Smoker  . Smokeless tobacco: Never Used  Substance and Sexual Activity  . Alcohol use: No  . Drug use: No  . Sexual activity: Yes    Birth control/protection: Pill  Lifestyle  . Physical activity:    Days per week: Not on file    Minutes per session: Not  on file  . Stress: Not on file  Relationships  . Social connections:    Talks on phone: Not on file    Gets together: Not on file    Attends religious service: Not on file    Active member of club or organization: Not on file    Attends meetings of clubs or organizations: Not on file    Relationship status: Not on file  Other Topics Concern  . Not on file  Social History Narrative  . Not on file    Family History  Problem Relation Age of Onset  . Heart disease Father   . Cancer Father   . Heart attack Brother        5/12  . Alzheimer's disease Maternal Grandmother   . Stroke Maternal Grandfather   . Stroke Paternal Grandfather      ROS Review of Systems See  HPI Constitution: No fevers or chills No malaise No diaphoresis Skin: No rash or itching Eyes: no blurry vision, no double vision GU: no dysuria or hematuria Neuro: no dizziness or headaches * all others reviewed and negative   Objective: Vitals:   01/25/18 1624  BP: 112/79  Pulse: 88  Resp: 16  Temp: 99.3 F (37.4 C)  TempSrc: Oral  SpO2: 98%  Weight: 212 lb 12.8 oz (96.5 kg)  Height:  (1.626 m)    Physical Exam  Constitutional: She is oriented to person, place, and time. She appears well-developed and well-nourished.  HENT:  Head: Normocephalic and atraumatic.  Eyes: Conjunctivae and EOM are normal.  Cardiovascular: Normal rate and regular rhythm.  No murmur heard. Pulmonary/Chest: Effort normal and breath sounds normal. No stridor. No respiratory distress. She has no wheezes. She has no rales.  Musculoskeletal: Normal range of motion.       Right ankle: She exhibits swelling and ecchymosis. She exhibits normal range of motion and no laceration. No tenderness. Achilles tendon exhibits no pain, no defect and normal Thompson's test results.  Superior to the ankle there is a tender area that measures 1.5cm wide by 4 cm long.  mild fluctuance, mild erythema  Neurological: She is alert and oriented to person, place, and time.  Psychiatric: She has a normal mood and affect. Her behavior is normal. Judgment and thought content normal.    Assessment and Plan [name] was seen today for lower leg laceration weekend before last.  Diagnoses and all orders for this visit:  Right ankle swelling  Hematoma of right lower extremity, initial encounter  Advised cryotherapy and antiinflammatory for one week Reviewed creatinine and advised pt to drink plenty of water and only take nsaid for one week duration  -     ibuprofen (ADVIL,MOTRIN) 800 MG tablet; Take 1 tablet (800 mg total) by mouth 2 (two) times daily for 7 days.     Nicole Jefferson A Nicole Jefferson

## 2018-01-25 NOTE — Patient Instructions (Addendum)
     IF you received an x-ray today, you will receive an invoice from [facility]. Please contact [facility] at [phone] with questions or concerns regarding your invoice.   IF you received labwork today, you will receive an invoice from [facility]. Please contact [facility] at [phone] with questions or concerns regarding your invoice.   Our billing staff will not be able to assist you with questions regarding bills from these companies.  You will be contacted with the lab results as soon as they are available. The fastest way to get your results is to activate your My Chart account. Instructions are located on the last page of this paperwork. If you have not heard from us regarding the results in 2 weeks, please contact this office.     Contusion A contusion is a deep bruise. Contusions are the result of a blunt injury to tissues and muscle fibers under the skin. The injury causes bleeding under the skin. The skin overlying the contusion may turn blue, purple, or yellow. Minor injuries will give you a painless contusion, but more severe contusions may stay painful and swollen for a few weeks. What are the causes? This condition is usually caused by a blow, trauma, or direct force to an area of the body. What are the signs or symptoms? Symptoms of this condition include:  Swelling of the injured area.  Pain and tenderness in the injured area.  Discoloration. The area may have redness and then turn blue, purple, or yellow.  How is this diagnosed? This condition is diagnosed based on a physical exam and medical history. An X-ray, CT scan, or MRI may be needed to determine if there are any associated injuries, such as broken bones (fractures). How is this treated? Specific treatment for this condition depends on what area of the body was injured. In general, the best treatment for a contusion is resting, icing, applying pressure to (compression), and elevating the  injured area. This is often called the RICE strategy. Over-the-counter anti-inflammatory medicines may also be recommended for pain control. Follow these instructions at home:  Rest the injured area.  If directed, apply ice to the injured area: ? Put ice in a plastic bag. ? Place a towel between your skin and the bag. ? Leave the ice on for 20 minutes, 2-3 times per day.  If directed, apply light compression to the injured area using an elastic bandage. Make sure the bandage is not wrapped too tightly. Remove and reapply the bandage as directed by your health care provider.  If possible, raise (elevate) the injured area above the level of your heart while you are sitting or lying down.  Take over-the-counter and prescription medicines only as told by your health care provider. Contact a health care provider if:  Your symptoms do not improve after several days of treatment.  Your symptoms get worse.  You have difficulty moving the injured area. Get help right away if:  You have severe pain.  You have numbness in a hand or foot.  Your hand or foot turns pale or cold. This information is not intended to replace advice given to you by your health care provider. Make sure you discuss any questions you have with your health care provider. Document Released: 06/04/2005 Document Revised: 01/03/2016 Document Reviewed: 01/10/2015 Elsevier Interactive Patient Education  2018 [employer] Inc[employer]  

## 2018-03-01 ENCOUNTER — Other Ambulatory Visit: Payer: Self-pay | Admitting: Obstetrics and Gynecology

## 2018-12-05 IMAGING — CT CT ABD-PELV W/ CM
2 of 5 series · 16 of 46 positions shown, 18 images · IV contrast (APPLIED)
Comparison: None.

CLINICAL DATA: Abdominal pain with appendicitis suspected. Vomiting
last night. Right lower quadrant tenderness today.

EXAM:
CT ABDOMEN AND PELVIS WITH CONTRAST
TECHNIQUE: Multidetector CT imaging of the abdomen and pelvis was performed
using the standard protocol following bolus administration of
intravenous contrast.
CONTRAST:  100mL UAEFHJ-FKK IOPAMIDOL (UAEFHJ-FKK) INJECTION 61%

[Series 2: axial st · axial · 0.98mm/px · z∈[+612,+1017]mm · 13 of 93 slices shown, 15 images]
[im 6/93  soft-tissue]
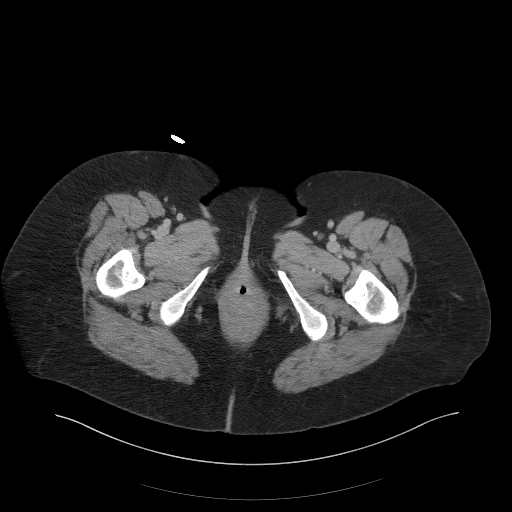
[im 6/93  bone]
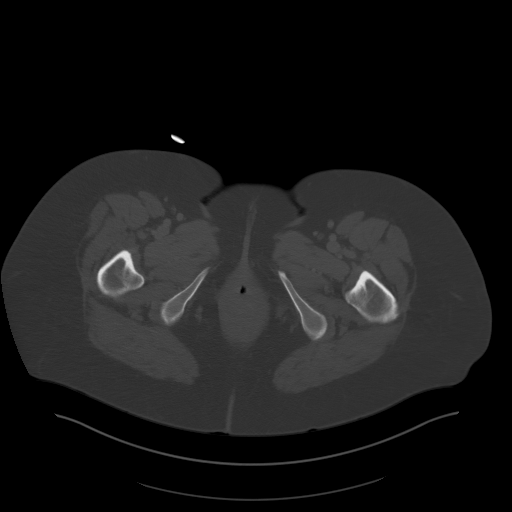
[im 11/93  soft-tissue]
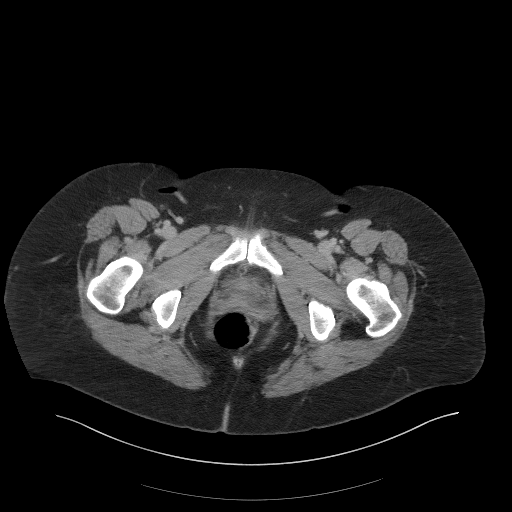
[im 21/93  soft-tissue]
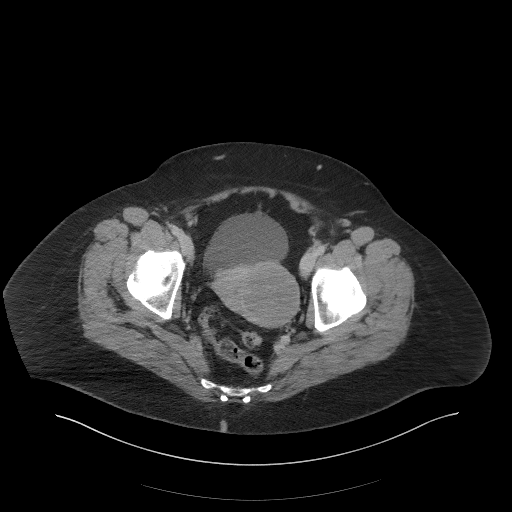
[im 26/93  soft-tissue]
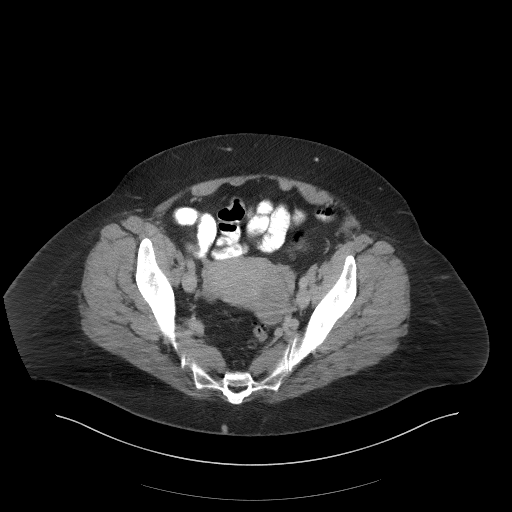
[im 31/93  soft-tissue]
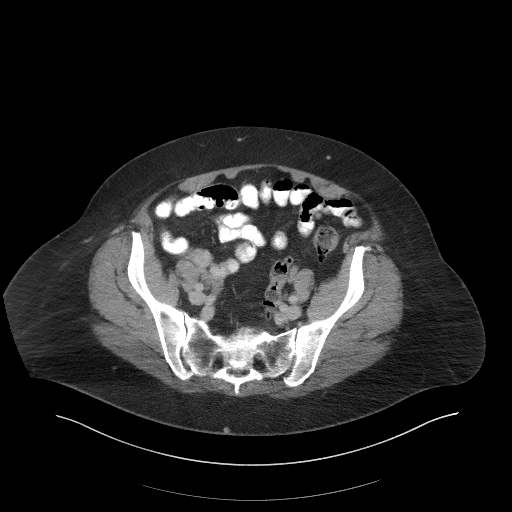
[im 41/93  soft-tissue]
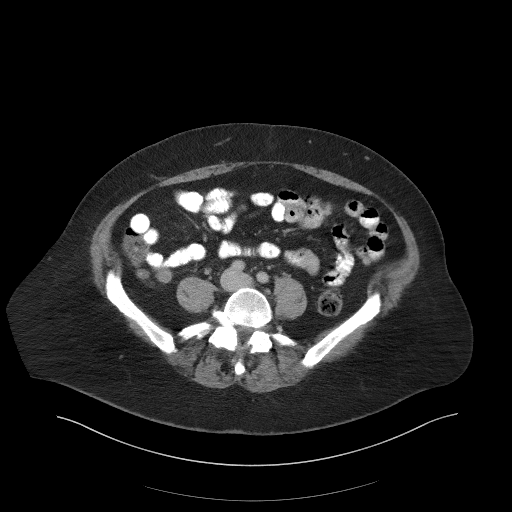
[im 47/93  soft-tissue]
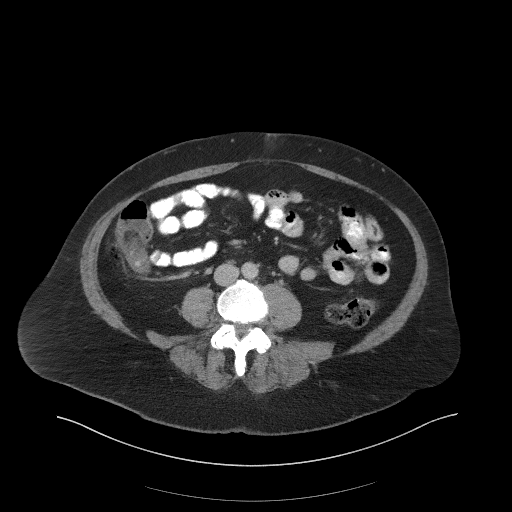
[im 52/93  soft-tissue]
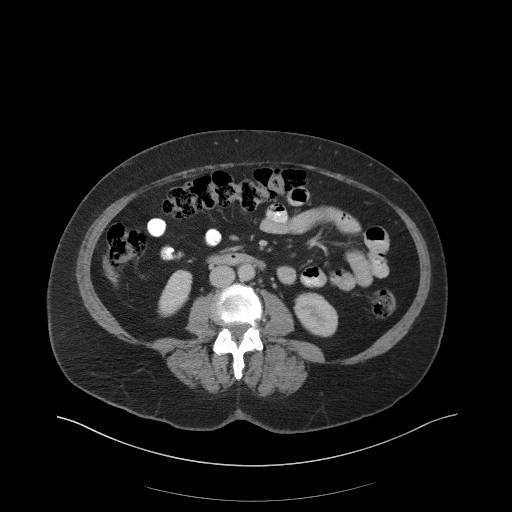
[im 62/93  soft-tissue]
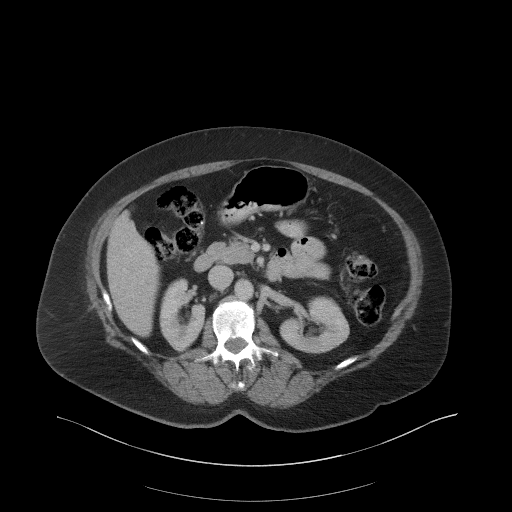
[im 62/93  bone]
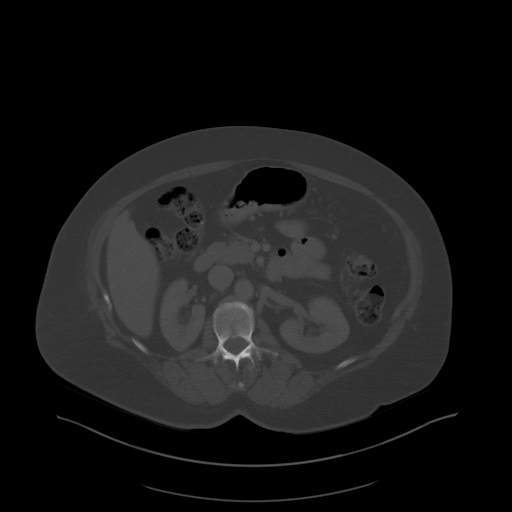
[im 67/93  soft-tissue]
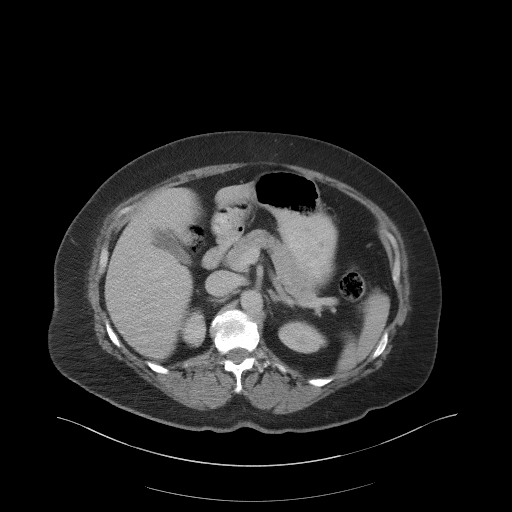
[im 72/93  soft-tissue]
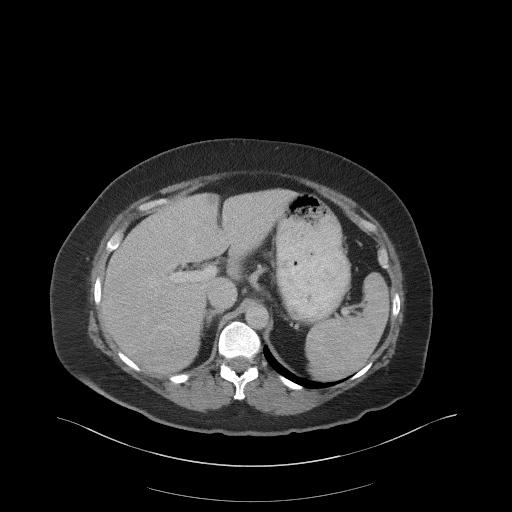
[im 82/93  soft-tissue]
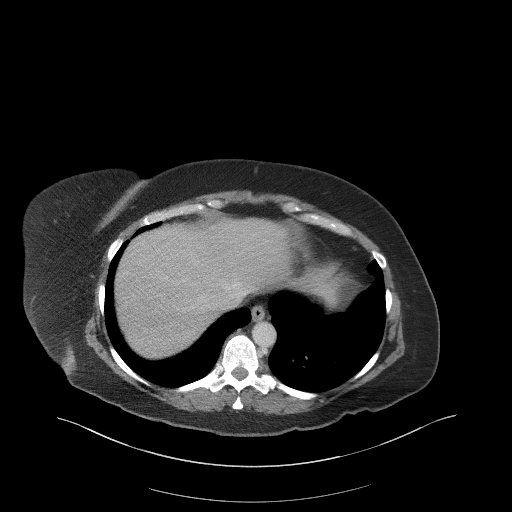
[im 87/93  soft-tissue]
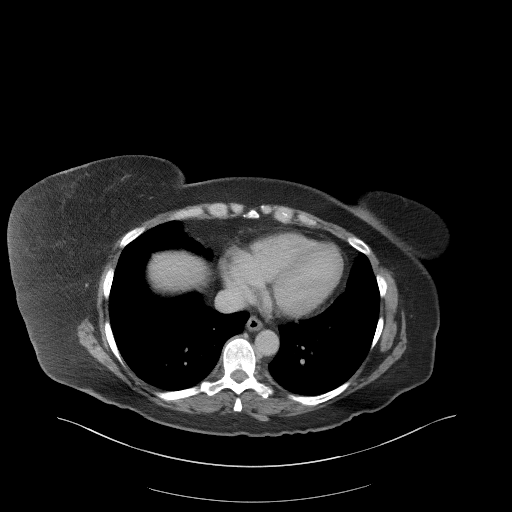

[Series 5: coronal st · coronal · 0.83mm/px · 3 of 100 slices shown]
[im 34/100  soft-tissue]
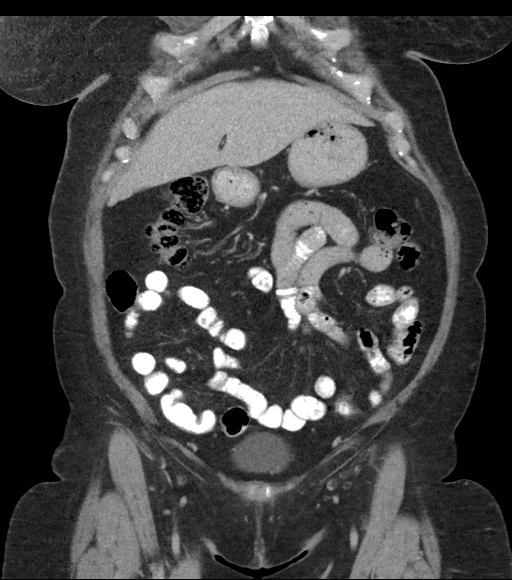
[im 45/100  soft-tissue]
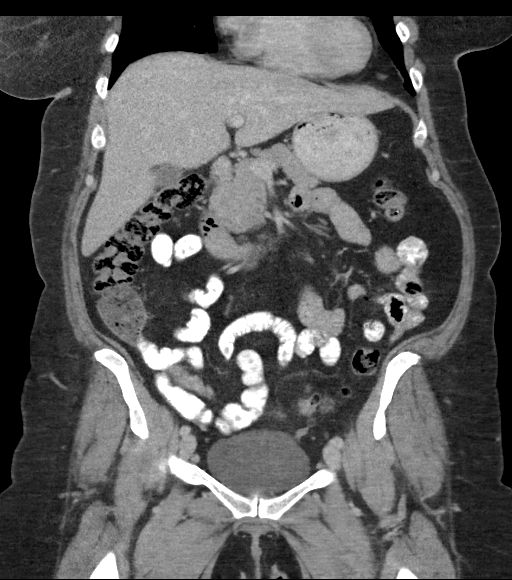
[im 56/100  soft-tissue]
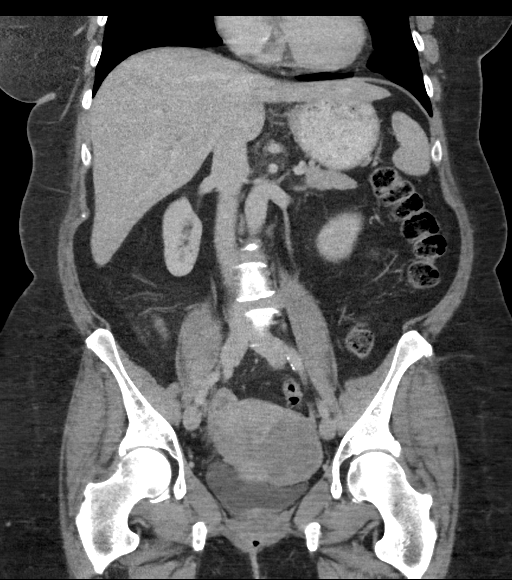

[16 of 46 positions shown; findings below may reference images not displayed]

FINDINGS: Lower chest:  No contributory findings.

Hepatobiliary: No focal liver abnormality.No evidence of biliary
obstruction or stone.

Pancreas: Unremarkable.

Spleen: Unremarkable.

Adrenals/Urinary Tract: Negative adrenals. No hydronephrosis or
ureteral calculus. There is an 8 mm stone in the upper pole left
kidney. Left renal sinus cysts. Unremarkable bladder.

Stomach/Bowel: Equivocal appendix. There is luminal distention to 7
mm but no detected wall thickening or mesial appendiceal
inflammation. High-density within the lumen the is likely the same
high-density material seen in the distal ileum, as opposed to
appendicolith.

Distal colonic diverticulosis.

Vascular/Lymphatic: No acute vascular abnormality. No mass or
adenopathy.

Reproductive:Intramural hypoenhancing mass within the left uterus
measuring 6.2 cm, consistent with fibroid. Negative ovaries.

Other: No ascites or pneumoperitoneum.  Fatty umbilical hernia.

Musculoskeletal: Disc degeneration and facet arthropathy with
dextroscoliosis of the lumbar spine. No acute finding.
IMPRESSION: 1. Equivocal for early appendicitis. The appendix lumen is distended
to 7 mm but there is no wall thickening and nomesoappendix
inflammation.
2. 6 cm intramural fibroid.
3. Colonic diverticulosis.
4. 8 mm left renal calculus.
5. Fatty umbilical hernia.

## 2018-12-13 ENCOUNTER — Telehealth (INDEPENDENT_AMBULATORY_CARE_PROVIDER_SITE_OTHER): Payer: 59 | Admitting: Family Medicine

## 2018-12-13 ENCOUNTER — Other Ambulatory Visit: Payer: Self-pay

## 2018-12-13 DIAGNOSIS — E559 Vitamin D deficiency, unspecified: Secondary | ICD-10-CM | POA: Diagnosis not present

## 2018-12-13 DIAGNOSIS — Z86010 Personal history of colonic polyps: Secondary | ICD-10-CM | POA: Diagnosis not present

## 2018-12-13 DIAGNOSIS — I10 Essential (primary) hypertension: Secondary | ICD-10-CM | POA: Diagnosis not present

## 2018-12-13 DIAGNOSIS — Z0001 Encounter for general adult medical examination with abnormal findings: Secondary | ICD-10-CM | POA: Diagnosis not present

## 2018-12-13 DIAGNOSIS — R200 Anesthesia of skin: Secondary | ICD-10-CM | POA: Diagnosis not present

## 2018-12-13 DIAGNOSIS — Z0000 Encounter for general adult medical examination without abnormal findings: Secondary | ICD-10-CM

## 2018-12-13 DIAGNOSIS — R202 Paresthesia of skin: Secondary | ICD-10-CM | POA: Diagnosis not present

## 2018-12-13 NOTE — Progress Notes (Signed)
CC: [facility]- CPE, concerns about numbness in arms/hands.  No travel outside the country or [place] in past 3 weeks.

## 2018-12-13 NOTE — Progress Notes (Signed)
Telemedicine Encounter- SOAP NOTE Established Patient  This telephone encounter was conducted with the patient's (or proxy's) verbal consent via audio telecommunications: yes/no: Yes Patient was instructed to have this encounter in a suitably private space; and to only have persons present to whom they give permission to participate. In addition, patient identity was confirmed by use of name plus two identifiers (DOB and address).  I discussed the limitations, risks, security and privacy concerns of performing an evaluation and management service by telephone and the availability of in person appointments. I also discussed with the patient that there may be a patient responsible charge related to this service. The patient expressed understanding and agreed to proceed.  I spent a total of TIME; 0 MIN TO 60 MIN: 25 minutes talking with the patient or their proxy.  No chief complaint on file.   Subjective   Nicole Jefferson is a 58 y.o. established patient. Telephone visit today for  HPI  Numbness and tingling Patient has been working in the yard International aid/development worker She has some muscle ache Sleep on her arms Symptoms are numbness from her hands and wrists Once she shakes it out it is goes away She is typing  She also clicking on the mouse and typing on the computer No oral twitching  No numbness in the legs She has a history of RLS  Health Maintenance Exam Mammogram up to date Pap smear up to date Colonoscopy due in 2021 Dental exam up to date Vision exam up to date tdap 2012 Exercises: daily in the yard  Health Maintenance   Health Habits: Dental Exam: up to date Eye Exam: up to date Exercise: 7  times/week on average Current exercise activities: walking/running Diet: balanced  GYN: Sexual Health Menstrual status: regular menses LMP: No LMP recorded. (Menstrual status: Perimenopausal). Last pap smear: see HM section History of abnormal pap smears:  Sexually active:  with female partner Current contraception: none  Health Maintenance: See under health Maintenance activity for review of completion dates as well. Immunization History  Administered Date(s) Administered  . Hepatitis B, ped/adol 01/26/2014, 03/01/2014, 07/19/2014  . Influenza Split 06/08/2012  . Influenza-Unspecified 05/10/2015  . Tdap 06/26/2011      Depression Screen-PHQ2/9 Depression screen [facility] 2/9 01/25/2018 01/31/2017 08/28/2016 06/04/2016 05/02/2015  Decreased Interest 0 0 0 0 0  Down, Depressed, Hopeless 0 0 0 0 0  PHQ - 2 Score 0 0 0 0 0    Depression Severity and Treatment Recommendations:  0-4= None  5-9= Mild / Treatment: Support, educate to call if worse; return in one month  10-14= Moderate / Treatment: Support, watchful waiting; Antidepressant or Psycotherapy  15-19= Moderately severe / Treatment: Antidepressant OR Psychotherapy  >= 20 = Major depression, severe / Antidepressant AND Psychotherapy    Patient Active Problem List   Diagnosis Date Noted  . Appendicitis 12/29/2017  . Family history of colon cancer 10/05/2012  . Venous disease 05/19/2012  . Varicose veins of left lower extremity with inflammation 05/19/2012  . Ganglion cyst of wrist 10/13/2011  . Hyperlipidemia 01/10/2011    Past Medical History:  Diagnosis Date  . Borderline hyperlipidemia   . Borderline systolic HTN   . Colon polyp   . Obese   . Palpitations     Current Outpatient Medications  Medication Sig Dispense Refill  . cholecalciferol (VITAMIN D) 1000 units tablet Take 5,000 Units by mouth daily.    Marland Kitchen ELDERBERRY PO Take by mouth.    . Multiple Vitamin (  MULTIVITAMIN) LIQD Take 5 mLs by mouth daily.    . SUPER B COMPLEX/C PO Take by mouth.    Marland Kitchen lisinopril-hydrochlorothiazide (ZESTORETIC) 10-12.5 MG tablet TAKE ONE TABLET BY MOUTH DAILY 30 tablet 1   No current facility-administered medications for this visit.     No Known Allergies  Social History   Socioeconomic History  .  Marital status: Married    Spouse name: Not on file  . Number of children: 4  . Years of education: Not on file  . Highest education level: Not on file  Occupational History  . Occupation: accounts payable  Social Needs  . Financial resource strain: Not on file  . Food insecurity    Worry: Not on file    Inability: Not on file  . Transportation needs    Medical: Not on file    Non-medical: Not on file  Tobacco Use  . Smoking status: Never Smoker  . Smokeless tobacco: Never Used  Substance and Sexual Activity  . Alcohol use: No  . Drug use: No  . Sexual activity: Yes    Birth control/protection: Pill  Lifestyle  . Physical activity    Days per week: Not on file    Minutes per session: Not on file  . Stress: Not on file  Relationships  . Social Musician on phone: Not on file    Gets together: Not on file    Attends religious service: Not on file    Active member of club or organization: Not on file    Attends meetings of clubs or organizations: Not on file    Relationship status: Not on file  . Intimate partner violence    Fear of current or ex partner: Not on file    Emotionally abused: Not on file    Physically abused: Not on file    Forced sexual activity: Not on file  Other Topics Concern  . Not on file  Social History Narrative  . Not on file    ROS  Review of Systems  Constitutional: Negative for activity change, appetite change, chills and fever.  HENT: Negative for congestion, nosebleeds, trouble swallowing and voice change.   Respiratory: Negative for cough, shortness of breath and wheezing.   Gastrointestinal: Negative for diarrhea, nausea and vomiting.  Genitourinary: Negative for difficulty urinating, dysuria, flank pain and hematuria.  Musculoskeletal: Negative for back pain, joint swelling and neck pain.  Neurological: Negative for dizziness, speech difficulty, light-headedness. See hpi See HPI. All other review of systems negative.     Objective  Virtual visit No exam performed  Vitals as reported by the patient: There were no vitals filed for this visit.  Diagnoses and all orders for this visit:  Encounter for health maintenance examination in adult- Women's Health Maintenance Plan Advised monthly breast exam and annual mammogram Advised dental exam every six months Discussed stress management Discussed pap smear screening guidelines    Essential hypertension- bp stable, cpm -     Comprehensive metabolic panel; Future -     Lipid panel; Future  History of colonic polyps  Vitamin D insufficiency -     VITAMIN D 25 Hydroxy (Vit-D Deficiency, Fractures); Future  Numbness and tingling in both hands- will check for deficiencies -     TSH; Future -     CBC; Future -     VITAMIN D 25 Hydroxy (Vit-D Deficiency, Fractures); Future -     Vitamin B12; Future  I discussed the assessment and treatment plan with the patient. The patient was provided an opportunity to ask questions and all were answered. The patient agreed with the plan and demonstrated an understanding of the instructions.   The patient was advised to call back or seek an in-person evaluation if the symptoms worsen or if the condition fails to improve as anticipated.  I provided 25 minutes of non-face-to-face time during this encounter.  Forrest Moron, MD  Primary Care at [facility]

## 2018-12-13 NOTE — Patient Instructions (Signed)
° ° ° °  If you have lab work done today you will be contacted with your lab results within the next 2 weeks.  If you have not heard from us then please contact us. The fastest way to get your results is to register for My Chart. ° ° °IF you received an x-ray today, you will receive an invoice from [facility]. Please contact [facility] at [phone] with questions or concerns regarding your invoice.  ° °IF you received labwork today, you will receive an invoice from [facility]. Please contact [facility] at [phone] with questions or concerns regarding your invoice.  ° °Our billing staff will not be able to assist you with questions regarding bills from these companies. ° °You will be contacted with the lab results as soon as they are available. The fastest way to get your results is to activate your My Chart account. Instructions are located on the last page of this paperwork. If you have not heard from us regarding the results in 2 weeks, please contact this office. °  ° ° ° °

## 2018-12-14 ENCOUNTER — Other Ambulatory Visit: Payer: Self-pay

## 2018-12-14 ENCOUNTER — Ambulatory Visit (INDEPENDENT_AMBULATORY_CARE_PROVIDER_SITE_OTHER): Payer: 59 | Admitting: Family Medicine

## 2018-12-14 DIAGNOSIS — E559 Vitamin D deficiency, unspecified: Secondary | ICD-10-CM

## 2018-12-14 DIAGNOSIS — R202 Paresthesia of skin: Secondary | ICD-10-CM

## 2018-12-14 DIAGNOSIS — I10 Essential (primary) hypertension: Secondary | ICD-10-CM

## 2018-12-14 DIAGNOSIS — R200 Anesthesia of skin: Secondary | ICD-10-CM

## 2018-12-15 LAB — TSH: TSH: 2.65 u[IU]/mL (ref 0.450–4.500)

## 2018-12-15 LAB — CBC
Hematocrit: 43.3 % (ref 34.0–46.6)
Hemoglobin: 14.4 g/dL (ref 11.1–15.9)
MCH: 30.9 pg (ref 26.6–33.0)
MCHC: 33.3 g/dL (ref 31.5–35.7)
MCV: 93 fL (ref 79–97)
Platelets: 252 10*3/uL (ref 150–450)
RBC: 4.66 x10E6/uL (ref 3.77–5.28)
RDW: 12.5 % (ref 11.7–15.4)
WBC: 4.4 10*3/uL (ref 3.4–10.8)

## 2018-12-15 LAB — COMPREHENSIVE METABOLIC PANEL
ALT: 14 IU/L (ref 0–32)
AST: 16 IU/L (ref 0–40)
Albumin/Globulin Ratio: 1.8 (ref 1.2–2.2)
Albumin: 4.3 g/dL (ref 3.8–4.9)
Alkaline Phosphatase: 59 IU/L (ref 39–117)
BUN/Creatinine Ratio: 21 (ref 9–23)
BUN: 17 mg/dL (ref 6–24)
Bilirubin Total: 0.4 mg/dL (ref 0.0–1.2)
CO2: 25 mmol/L (ref 20–29)
Calcium: 9.8 mg/dL (ref 8.7–10.2)
Chloride: 102 mmol/L (ref 96–106)
Creatinine, Ser: 0.82 mg/dL (ref 0.57–1.00)
GFR calc Af Amer: 91 mL/min/{1.73_m2} (ref >59)
GFR calc non Af Amer: 79 mL/min/{1.73_m2} (ref >59)
Globulin, Total: 2.4 g/dL (ref 1.5–4.5)
Glucose: 102 mg/dL — ABNORMAL HIGH (ref 65–99)
Potassium: 4.5 mmol/L (ref 3.5–5.2)
Sodium: 142 mmol/L (ref 134–144)
Total Protein: 6.7 g/dL (ref 6.0–8.5)

## 2018-12-15 LAB — LIPID PANEL
Chol/HDL Ratio: 5 ratio — ABNORMAL HIGH (ref 0.0–4.4)
Cholesterol, Total: 228 mg/dL — ABNORMAL HIGH (ref 100–199)
HDL: 46 mg/dL (ref >39)
LDL Calculated: 147 mg/dL — ABNORMAL HIGH (ref 0–99)
Triglycerides: 177 mg/dL — ABNORMAL HIGH (ref 0–149)
VLDL Cholesterol Cal: 35 mg/dL (ref 5–40)

## 2018-12-15 LAB — VITAMIN B12: Vitamin B-12: 290 pg/mL (ref 232–1245)

## 2018-12-15 LAB — VITAMIN D 25 HYDROXY (VIT D DEFICIENCY, FRACTURES): Vit D, 25-Hydroxy: 36.1 ng/mL (ref 30.0–100.0)

## 2019-01-06 ENCOUNTER — Other Ambulatory Visit: Payer: Self-pay | Admitting: Physician Assistant

## 2019-01-06 DIAGNOSIS — I10 Essential (primary) hypertension: Secondary | ICD-10-CM

## 2019-01-06 NOTE — Telephone Encounter (Signed)
Lisinopril-HCTZ refilled. 

## 2019-03-08 ENCOUNTER — Telehealth: Payer: Self-pay | Admitting: Physician Assistant

## 2019-03-08 NOTE — Telephone Encounter (Signed)
° ° °  Virtual Visit Pre-Appointment Phone Call  1. 3. consent - "In the setting of the current Covid19 crisis, you are scheduled for a (phone or video) visit with your provider on (date) at (time).  Just as we do with many in-office visits, in order for you to participate in this visit, we must obtain consent.  If you'd like, I can send this to your mychart (if signed up) or email for you to review.  Otherwise, I can obtain your verbal consent now.  All virtual visits are billed to your insurance company just like a normal visit would be.  By agreeing to a virtual visit, we'd like you to understand that the technology does not allow for your provider to perform an examination, and thus may limit your provider's ability to fully assess your condition. If your provider identifies any concerns that need to be evaluated in person, we will make arrangements to do so.  Finally, though the technology is pretty good, we cannot assure that it will always work on either your or our end, and in the setting of a video visit, we may have to convert it to a phone-only visit.  In either situation, we cannot ensure that we have a secure connection.  Are you willing to proceed?" STAFF: Did the patient verbally acknowledge consent to telehealth visit? Document YES/NO here:  Yes   I called pt to confirm pt's Virtual Visit with Almyra Deforest on 03-09-19.

## 2019-03-09 ENCOUNTER — Telehealth (INDEPENDENT_AMBULATORY_CARE_PROVIDER_SITE_OTHER): Payer: 59 | Admitting: Physician Assistant

## 2019-03-09 ENCOUNTER — Encounter: Payer: Self-pay | Admitting: Physician Assistant

## 2019-03-09 ENCOUNTER — Telehealth: Payer: Self-pay

## 2019-03-09 VITALS — BP 133/85 | Ht 64.0 in | Wt 218.0 lb

## 2019-03-09 DIAGNOSIS — E785 Hyperlipidemia, unspecified: Secondary | ICD-10-CM | POA: Diagnosis not present

## 2019-03-09 DIAGNOSIS — I10 Essential (primary) hypertension: Secondary | ICD-10-CM | POA: Diagnosis not present

## 2019-03-09 DIAGNOSIS — Z8249 Family history of ischemic heart disease and other diseases of the circulatory system: Secondary | ICD-10-CM | POA: Diagnosis not present

## 2019-03-09 MED ORDER — LISINOPRIL-HYDROCHLOROTHIAZIDE 10-12.5 MG PO TABS: 1.0000 | ORAL_TABLET | Freq: Every day | ORAL | 3 refills | Status: AC

## 2019-03-09 NOTE — Patient Instructions (Addendum)
Medication Instructions:   Your physician recommends that you continue on your current medications as directed. Please refer to the Current Medication list given to you today.  If you need a refill on your cardiac medications before your next appointment, please call your pharmacy.   Lab work:  You will need to have labs (blood work) drawn in 6 months. Please have the results sent to our office and the office of your PCP If you have labs (blood work) drawn today and your tests are completely normal, you will receive your results only by: Marland Kitchen MyChart Message (if you have MyChart) OR . A paper copy in the mail If you have any lab test that is abnormal or we need to change your treatment, we will call you to review the results.  Testing/Procedures: NONE ordered at this time of appointment   Follow-Up: At [facility], you and your health needs are our priority.  As part of our continuing mission to provide you with exceptional heart care, we have created designated Provider Care Teams.  These Care Teams include your primary Cardiologist (physician) and Advanced Practice Providers (APPs -  Physician Assistants and Nurse Practitioners) who all work together to provide you with the care you need, when you need it. You will need a follow up appointment in 12 months.  Please call our office 2 months in advance to schedule this appointment.  You may see Peter Martinique, MD or one of the following Advanced Practice Providers on your designated Care Team: [place], [place] . Fabian Sharp, PA-C  Any Other Special Instructions Will Be Listed Below (If Applicable).  Diet and exercise to help lower cholesterol

## 2019-03-09 NOTE — Progress Notes (Signed)
Virtual Visit via Telephone Note   This visit type was conducted due to national recommendations for restrictions regarding the COVID-19 Pandemic (e.g. social distancing) in an effort to limit this patient's exposure and mitigate transmission in our community.  Due to her co-morbid illnesses, this patient is at least at moderate risk for complications without adequate follow up.  This format is felt to be most appropriate for this patient at this time.  The patient did not have access to video technology/had technical difficulties with video requiring transitioning to audio format only (telephone).  All issues noted in this document were discussed and addressed.  No physical exam could be performed with this format.  Please refer to the patient's chart for her  consent to telehealth for [facility].   Date:  03/11/2019   ID:  Nicole Jefferson, DOB February 11, 1961, MRN [id number]  Patient Location: Home Provider Location: Office  PCP:  Doristine BosworthStallings, Zoe A, MD  Cardiologist:  Peter SwazilandJordan, MD  Electrophysiologist:  None   Evaluation Performed:  Follow-Up Visit  Chief Complaint:  followup  History of Present Illness:    Nicole Jefferson is a 58 y.o. female with past medical history of obesity and elevated blood pressure.  She has strong family history of CAD.  Her father had bypass surgery in his 8250s and brother had MI in his 9140s.  She has a history of borderline blood pressure issue and hyperlipidemia.  Myoview in 2012 was normal.  She was last seen by Dr. SwazilandJordan in June 2017, she was doing well at the time.  It was recommended for her to follow-up on a as needed basis.  I last saw the patient in April 2019, she was doing well at the time although she did have occasional palpitation once every 1.5 to 2 months.  She underwent laparoscopic appendectomy in April 2019.  Patient was contacted today via telephone visit.  She has been doing well from cardiology perspective.  She denies any recent exertional  chest pain or shortness of breath.  She does not remember when was the last time she had any palpitation.  She is mainly working from home however she does go to her work site 2 days a week for half a day.  She denies any recent sick contact, fever, chill cough.  Her blood pressure seems to be quite well controlled at home, however she did not obtain her heart rate at home.  I will refill her blood pressure medications.  Her previous lab work in April 2020 showed a total cholesterol 228, HDL 46, triglyceride 177, LDL 147.  She wished to pursue a course of diet and exercise, the previous lab work was dated back to 2015, therefore I think it is reasonable to do a trial on diet and exercise.  I would recommend a repeat lipid panel in 2976-month, this can be done through her work using biometric screening.  If LDL remains elevated greater than 100, I would recommend she start on a statin therapy especially in light of her significant family history of CAD.  The patient does not have symptoms concerning for COVID-19 infection (fever, chills, cough, or new shortness of breath).    Past Medical History:  Diagnosis Date  . Borderline hyperlipidemia   . Borderline systolic HTN   . Colon polyp   . Obese   . Palpitations    Past Surgical History:  Procedure Laterality Date  . 48 hr monitor  01/04/09   no significant arrhythmia  .  ENDOVENOUS ABLATION SAPHENOUS VEIN W/ LASER  04/28/2012  . LAPAROSCOPIC APPENDECTOMY N/A 12/29/2017   Procedure: APPENDECTOMY LAPAROSCOPIC;  Surgeon: Alphonsa Overall, MD;  Location: [facility];  Service: General;  Laterality: N/A;  . TONSILLECTOMY    . US ECHOCARDIOGRAPHY  08/16/07   stress echo negative     Current Meds  Medication Sig  . cholecalciferol (VITAMIN D) 1000 units tablet Take 5,000 Units by mouth daily.  Marland Kitchen ELDERBERRY PO Take by mouth.  Marland Kitchen lisinopril-hydrochlorothiazide (ZESTORETIC) 10-12.5 MG tablet Take 1 tablet by mouth daily.  . Multiple Vitamin (MULTIVITAMIN) LIQD  Take 5 mLs by mouth daily.  . SUPER B COMPLEX/C PO Take by mouth.  . [DISCONTINUED] lisinopril-hydrochlorothiazide (ZESTORETIC) 10-12.5 MG tablet TAKE ONE TABLET BY MOUTH DAILY     Allergies:   Patient has no known allergies.   Social History   Tobacco Use  . Smoking status: Never Smoker  . Smokeless tobacco: Never Used  Substance Use Topics  . Alcohol use: No  . Drug use: No     Family Hx: The patient's family history includes Alzheimer's disease in her maternal grandmother; Cancer in her father; Heart attack in her brother; Heart disease in her father; Stroke in her maternal grandfather and paternal grandfather.  ROS:   Please see the history of present illness.     All other systems reviewed and are negative.   Prior CV studies:   The following studies were reviewed today:  Myoview 01/27/2011 Impression Exercise Capacity:  [place] with no exercise. BP Response:  Normal blood pressure response. Clinical Symptoms:  No chest pain. ECG Impression:  No significant ST segment change suggestive of ischemia. Comparison with Prior Nuclear Study: No previous nuclear study performed  Overall Impression:  Normal stress nuclear study.  No evidence of ischemia.  Normal LV function.  Labs/Other Tests and Data Reviewed:    EKG:  An ECG dated 12/29/2017 was personally reviewed today and demonstrated:  Normal sinus rhythm without significant ST-T wave changes  Recent Labs: 12/14/2018: ALT 14; BUN 17; Creatinine, Ser 0.82; Hemoglobin 14.4; Platelets 252; Potassium 4.5; Sodium 142; TSH 2.650   Recent Lipid Panel Lab Results  Component Value Date/Time   CHOL 228 (H) 12/14/2018 10:34 AM   TRIG 177 (H) 12/14/2018 10:34 AM   HDL 46 12/14/2018 10:34 AM   CHOLHDL 5.0 (H) 12/14/2018 10:34 AM   CHOLHDL 4.6 02/06/2014 09:48 PM   LDLCALC 147 (H) 12/14/2018 10:34 AM    Wt Readings from Last 3 Encounters:  03/09/19 218 lb (98.9 kg)  01/25/18 212 lb 12.8 oz (96.5 kg)  01/05/18 217 lb 13  oz (98.8 kg)     Objective:    Vital Signs:  BP 133/85   Ht 5\' 4"  (1.626 m)   Wt 218 lb (98.9 kg)   BMI 37.42 kg/m    VITAL SIGNS:  reviewed  ASSESSMENT & PLAN:    1. Hyperlipidemia: Recent LDL was elevated, however patient wished to pursue a course of diet and exercise first.  I recommended repeat lab work in 3 months, if her LDL is still greater than 100, I would recommend start on statin therapy.  2. Hypertension: Blood pressure stable on current therapy  3. Family history of CAD: Denies any recent exertional chest pain or shortness of breath.  COVID-19 Education: The signs and symptoms of COVID-19 were discussed with the patient and how to seek care for testing (follow up with PCP or arrange E-visit).  The importance of social distancing was discussed  today.  Time:   Today, I have spent 8 minutes with the patient with telehealth technology discussing the above problems.     Medication Adjustments/Labs and Tests Ordered: Current medicines are reviewed at length with the patient today.  Concerns regarding medicines are outlined above.   Tests Ordered: No orders of the defined types were placed in this encounter.   Medication Changes: Meds ordered this encounter  Medications  . lisinopril-hydrochlorothiazide (ZESTORETIC) 10-12.5 MG tablet    Sig: Take 1 tablet by mouth daily.    Dispense:  90 tablet    Refill:  3    Follow Up:  In Person in 1 year(s)  Signed, Azalee CourseHao Palmina Clodfelter, [place]  03/11/2019 12:43 AM    [facility]

## 2019-03-09 NOTE — Telephone Encounter (Signed)

## 2020-07-12 ENCOUNTER — Other Ambulatory Visit: Payer: Self-pay | Admitting: Obstetrics and Gynecology

## 2021-07-03 ENCOUNTER — Encounter: Payer: Self-pay | Admitting: Student

## 2021-07-03 DIAGNOSIS — Z87898 Personal history of other specified conditions: Secondary | ICD-10-CM | POA: Insufficient documentation

## 2021-07-03 NOTE — Progress Notes (Deleted)
Cardiology Office Note:    Date:  07/03/2021   ID:  Nicole Jefferson, DOB [birth year], MRN [id number]  PCP:  Doristine Bosworth, MD  Cardiologist:  Peter Swaziland, MD  Electrophysiologist:  None   Referring MD: Tracey Harries, MD   Chief Complaint: ***  History of Present Illness:    Nicole Jefferson is a 60 y.o. female with a history of palpitations, hypertension, hyperlipidemia, obesity, and significant family history of early CAD who is followed by Dr. Swaziland and presents today for ***.  Patient was initially seen by Cardiology in 2012 for evaluation of chest pain. Myoview was ordered for further evaluation and showed no evidence of ischemia. Patient was last seen by Dr. Swaziland in 2017 at which time she was doing well. It was recommended that she follow-up on an as needed basis. Patient was last seen by Azalee Course, PA-C, in 03/2019 for a virtual visit at which time she was doing well. Recent lipid panel showed LDL of 147. Patient wanted to do a trial of diet and exercise before starting any medications. However, statin was recommended if LDL was still above >100 at repeat check.  Patient presents today for follow-up. ***  Hypertension - BP well controlled. - Continue Lisinopril-HCTZ 10-12.5mg  daily.  Hyperlipidemia - Most recent lipid panel from ***  Family History of CAD - Patient has a significant family history of early CAD. Her father had a CABG in his 59s and her brother had a MI in his 98s.  - *** - coronary calcium score ***. - Aspirin ***  Past Medical History:  Diagnosis Date   Borderline hyperlipidemia    Borderline systolic HTN    Colon polyp    Obese    Palpitations     Past Surgical History:  Procedure Laterality Date   48 hr monitor  01/04/09   no significant arrhythmia   ENDOVENOUS ABLATION SAPHENOUS VEIN W/ LASER  04/28/2012   LAPAROSCOPIC APPENDECTOMY N/A 12/29/2017   Procedure: APPENDECTOMY LAPAROSCOPIC;  Surgeon: Ovidio Kin, MD;  Location: [facility];   Service: General;  Laterality: N/A;   TONSILLECTOMY     US ECHOCARDIOGRAPHY  08/16/07   stress echo negative    Current Medications: No outpatient medications have been marked as taking for the 07/04/21 encounter (Appointment) with Corrin Parker, PA-C.     Allergies:   Patient has no known allergies.   Social History   Socioeconomic History   Marital status: Married    Spouse name: Not on file   Number of children: 4   Years of education: Not on file   Highest education level: Not on file  Occupational History   Occupation: accounts payable  Tobacco Use   Smoking status: Never   Smokeless tobacco: Never  Substance and Sexual Activity   Alcohol use: No   Drug use: No   Sexual activity: Yes    Birth control/protection: Pill  Other Topics Concern   Not on file  Social History Narrative   Not on file   Social Determinants of Health   Financial Resource Strain: Not on file  Food Insecurity: Not on file  Transportation Needs: Not on file  Physical Activity: Not on file  Stress: Not on file  Social Connections: Not on file     Family History: The patient's family history includes Alzheimer's disease in her maternal grandmother; Cancer in her father; Heart attack in her brother; Heart disease in her father; Stroke in her maternal grandfather and paternal grandfather.  ROS:   Please see the history of present illness.     EKGs/Labs/Other Studies Reviewed:    The following studies were reviewed today:  Myoview 01/27/2011: Impression: Exercise Capacity: [place]isc[place] with no exercise. BP Response: Normal blood pressure response. Clinical Symptoms: No chest pain. ECG Impression: No significant ST segment change suggestive of ischemia. Comparison with Prior Nuclear Study: No previous nuclear study performed   Overall Impression:   Normal stress nuclear study. No evidence of ischemia. Normal LV function.  EKG:  EKG ordered today. EKG personally reviewed and  demonstrates ***.  Recent Labs: No results found for requested labs within last 8760 hours.  Recent Lipid Panel    Component Value Date/Time   CHOL 228 (H) 12/14/2018 1034   TRIG 177 (H) 12/14/2018 1034   HDL 46 12/14/2018 1034   CHOLHDL 5.0 (H) 12/14/2018 1034   CHOLHDL 4.6 02/06/2014 2148   VLDL 37 02/06/2014 2148   LDLCALC 147 (H) 12/14/2018 1034    Physical Exam:    Vital Signs: There were no vitals taken for this visit.    Wt Readings from Last 3 Encounters:  03/09/19 218 lb (98.9 kg)  01/25/18 212 lb 12.8 oz (96.5 kg)  01/05/18 217 lb 13 oz (98.8 kg)     General: 60 y.o. female in no acute distress. HEENT: Normocephalic and atraumatic. Sclera clear. EOMs intact. Neck: Supple. No carotid bruits. No JVD. Heart: *** RRR. Distinct S1 and S2. No murmurs, gallops, or rubs. Radial and distal pedal pulses 2+ and equal bilaterally. Lungs: No increased work of breathing. Clear to ausculation bilaterally. No wheezes, rhonchi, or rales.  Abdomen: Soft, non-distended, and non-tender to palpation. Bowel sounds present in all 4 quadrants.  MSK: Normal strength and tone for age. *** Extremities: No lower extremity edema.    Skin: Warm and dry. Neuro: Alert and oriented x3. No focal deficits. Psych: Normal affect. Responds appropriately.   Assessment:    No diagnosis found.  Plan:     Disposition: Follow up in ***   Medication Adjustments/Labs and Tests Ordered: Current medicines are reviewed at length with the patient today.  Concerns regarding medicines are outlined above.  No orders of the defined types were placed in this encounter.  No orders of the defined types were placed in this encounter.   There are no Patient Instructions on file for this visit.   Signed, Corrin Parker, PA-C  07/03/2021 12:19 PM    [facility]

## 2021-07-04 ENCOUNTER — Ambulatory Visit: Payer: 59 | Admitting: Student

## 2021-07-30 ENCOUNTER — Ambulatory Visit: Payer: 59 | Admitting: Physician Assistant

## 2021-10-08 NOTE — Progress Notes (Signed)
Office Visit    Patient Name: Nicole Jefferson Date of Encounter: 10/09/2021  Primary Care Provider:  Morrell Riddle, PA-C Primary Cardiologist:  Peter Swaziland, MD  Chief Complaint    61 year old female with a history of hypertension, hyperlipidemia, palpitations, strong family history of CAD, and obesity who presents for follow-up related to hypertension and hyperlipidemia.  Past Medical History    Past Medical History:  Diagnosis Date   Colon polyp    Hyperlipidemia    Hypertension    Obese    Past Surgical History:  Procedure Laterality Date   48 hr monitor  01/04/09   no significant arrhythmia   ENDOVENOUS ABLATION SAPHENOUS VEIN W/ LASER  04/28/2012   LAPAROSCOPIC APPENDECTOMY N/A 12/29/2017   Procedure: APPENDECTOMY LAPAROSCOPIC;  Surgeon: Ovidio Kin, MD;  Location: [facility];  Service: General;  Laterality: N/A;   TONSILLECTOMY     US ECHOCARDIOGRAPHY  08/16/07   stress echo negative    Allergies  No Known Allergies  History of Present Illness    61 year old female with the above past medical history including hypertension, hyperlipidemia, palpitations, strong family history of CAD, and obesity.  She has a strong family history of CAD in her father and brother. 48-hour monitor in 2010 in the setting of palpitations was negative for arrhythmia. Myoview in 2012 was negative for ischemia. She saw Dr. Swaziland in June 2017 and was doing well at the time; follow-up was recommended as needed. She was seen virtually in July 2020 was doing well from a cardiac standpoint. Her cholesterol was elevated at the time, however, she preferred to pursue lifestyle modifications with diet and exercise rather than initiate statin therapy. She has not been seen in follow-up since. She saw her primary care doctor in October 2022 who recommended she follow-up with cardiology given history of hypertension, hyperlipidemia and strong family history of CAD.  She presents today for follow-up.  Since her last visit she has done well from a cardiac standpoint. She denies any symptoms concerning for angina. She reports stable, rare, fleeting palpitations. She was started on Crestor per her PCP. She is working on increasing her activity with more regular exercise. She is following with orthopedics for R mid-lower back pain and was recently started on prednisone for a pulled muscle, she has a history scoliosis and was told this could be contributing to her muscle pain. Overall, she reports feeling well and denies any specific concerns or complaints today.    Home Medications    Current Outpatient Medications  Medication Sig Dispense Refill   cholecalciferol (VITAMIN D) 1000 units tablet Take 5,000 Units by mouth daily.     lisinopril-hydrochlorothiazide (ZESTORETIC) 10-12.5 MG tablet Take 1 tablet by mouth daily. 90 tablet 3   methylPREDNISolone (MEDROL DOSEPAK) 4 MG TBPK tablet Medrol (Pak) 4 mg tablets in a dose pack  Take 1 dose pk by oral route.     Multiple Vitamin (MULTIVITAMIN) LIQD Take 5 mLs by mouth daily.     rosuvastatin (CRESTOR) 20 MG tablet Take 1 tablet by mouth at bedtime.     SUPER B COMPLEX/C PO Take by mouth.     ELDERBERRY PO Take by mouth. (Patient not taking: Reported on 10/09/2021)     No current facility-administered medications for this visit.     Review of Systems    She denies chest pain, palpitations, dyspnea, pnd, orthopnea, n, v, dizziness, syncope, edema, weight gain, or early satiety. All other systems reviewed and are otherwise  negative except as noted above.   Physical Exam    VS:  BP 133/81    Pulse 70    Ht 5\' 5"  (1.651 m)    Wt 229 lb 12.8 oz (104.2 kg)    SpO2 96%    BMI 38.24 kg/m  GEN: Well nourished, well developed, in no acute distress. HEENT: normal. Neck: Supple, no JVD, carotid bruits, or masses. Cardiac: RRR, no murmurs, rubs, or gallops. No clubbing, cyanosis, edema.  Radials/DP/PT 2+ and equal bilaterally.  Respiratory:  Respirations  regular and unlabored, clear to auscultation bilaterally. GI: Soft, nontender, nondistended, BS + x 4. MS: no deformity or atrophy. Skin: warm and dry, no rash. Neuro:  Strength and sensation are intact. Psych: Normal affect.  Accessory Clinical Findings    ECG personally reviewed by me today - NSR, 70 bpm - no acute changes.  Lab Results  Component Value Date   WBC 4.4 12/14/2018   HGB 14.4 12/14/2018   HCT 43.3 12/14/2018   MCV 93 12/14/2018   PLT 252 12/14/2018   Lab Results  Component Value Date   CREATININE 0.82 12/14/2018   BUN 17 12/14/2018   NA 142 12/14/2018   K 4.5 12/14/2018   CL 102 12/14/2018   CO2 25 12/14/2018   Lab Results  Component Value Date   ALT 14 12/14/2018   AST 16 12/14/2018   ALKPHOS 59 12/14/2018   BILITOT 0.4 12/14/2018   Lab Results  Component Value Date   CHOL 228 (H) 12/14/2018   HDL 46 12/14/2018   LDLCALC 147 (H) 12/14/2018   TRIG 177 (H) 12/14/2018   CHOLHDL 5.0 (H) 12/14/2018    No results found for: HGBA1C  Assessment & Plan    1. Family history of CAD: Family history of CAD in her father and brother. Myoview in 2012 was negative for ischemia. Stable with no anginal symptoms. No indication for ischemic evaluation at this time. The 10-year ASCVD risk score (Arnett DK, et al., 2019) is: 10.4%. She is on Crestor 20 mg daily. LDL goal is  <70. Encouraged continued lifestyle medications with diet and exercise. If LDL remains above goal, consider escalation of statin dose. Start ASA 81 mg daily. If she develops symptoms concerning for angina, consider coronary CTA.    2. Hypertension: BP well controlled. Continue current antihypertensive regimen.   3. Hyperlipidemia, LDL goal <70: Continue statin as above.   4. Palpitations: 48-hour monitor in 2010 in the setting of palpitations was negative for arrhythmia. Stable, rare, fleeting palpitations. EKG today shows NSR, 70 bpm.   5. Disposition: Follow-up in 1 year.   2011,  NP 10/09/2021, 4:27 PM

## 2021-10-09 ENCOUNTER — Other Ambulatory Visit: Payer: Self-pay

## 2021-10-09 ENCOUNTER — Ambulatory Visit (INDEPENDENT_AMBULATORY_CARE_PROVIDER_SITE_OTHER): Payer: 59 | Admitting: Nurse Practitioner

## 2021-10-09 ENCOUNTER — Encounter: Payer: Self-pay | Admitting: Physician Assistant

## 2021-10-09 VITALS — BP 133/81 | HR 70 | Ht 65.0 in | Wt 229.8 lb

## 2021-10-09 DIAGNOSIS — E785 Hyperlipidemia, unspecified: Secondary | ICD-10-CM

## 2021-10-09 DIAGNOSIS — Z8249 Family history of ischemic heart disease and other diseases of the circulatory system: Secondary | ICD-10-CM

## 2021-10-09 DIAGNOSIS — R002 Palpitations: Secondary | ICD-10-CM | POA: Diagnosis not present

## 2021-10-09 DIAGNOSIS — I10 Essential (primary) hypertension: Secondary | ICD-10-CM

## 2021-10-09 NOTE — Patient Instructions (Addendum)
Medication Instructions:  Start Enteric-Coated Asprin 81 mg daily  *If you need a refill on your cardiac medications before your next appointment, please call your pharmacy*   Lab Work: NONE ordered at this time of appointment   If you have labs (blood work) drawn today and your tests are completely normal, you will receive your results only by: MyChart Message (if you have MyChart) OR A paper copy in the mail If you have any lab test that is abnormal or we need to change your treatment, we will call you to review the results.   Testing/Procedures: NONE ordered at this time of appointment     Follow-Up: At [facility], you and your health needs are our priority.  As part of our continuing mission to provide you with exceptional heart care, we have created designated Provider Care Teams.  These Care Teams include your primary Cardiologist (physician) and Advanced Practice Providers (APPs -  Physician Assistants and Nurse Practitioners) who all work together to provide you with the care you need, when you need it.  We recommend signing up for the patient portal called "MyChart".  Sign up information is provided on this After Visit Summary.  MyChart is used to connect with patients for Virtual Visits (Telemedicine).  Patients are able to view lab/test results, encounter notes, upcoming appointments, etc.  Non-urgent messages can be sent to your provider as well.   To learn more about what you can do with MyChart, go to [email or web address].    Your next appointment:   1 year(s)  The format for your next appointment:   In Person  Provider:   Peter Swaziland, MD     Other Instructions Goal for LDL level is less than 70

## 2024-05-12 ENCOUNTER — Other Ambulatory Visit: Payer: Self-pay | Admitting: Obstetrics and Gynecology

## 2024-05-12 DIAGNOSIS — Z1231 Encounter for screening mammogram for malignant neoplasm of breast: Secondary | ICD-10-CM

## 2024-07-18 ENCOUNTER — Ambulatory Visit

## 2024-07-19 ENCOUNTER — Ambulatory Visit
Admission: RE | Admit: 2024-07-19 | Discharge: 2024-07-19 | Disposition: A | Discharge status: Home / Self Care | Source: Ambulatory Visit | Attending: Obstetrics and Gynecology | Admitting: Obstetrics and Gynecology

## 2024-07-19 DIAGNOSIS — Z1231 Encounter for screening mammogram for malignant neoplasm of breast: Secondary | ICD-10-CM
